# Patient Record
Sex: Female | Born: 1950
Health system: Southern US, Community
[De-identification: ages and names within clinical notes are randomized; demographics above are authoritative.]

## PROBLEM LIST (undated history)

## (undated) HISTORY — PX: FOOT SURGERY: SHX648

---

## 2017-04-20 ENCOUNTER — Encounter: Payer: Self-pay | Admitting: Emergency Medicine

## 2017-04-20 ENCOUNTER — Emergency Department: Payer: Medicare Other

## 2017-04-20 ENCOUNTER — Emergency Department
Admission: EM | Admit: 2017-04-20 | Discharge: 2017-04-21 | Disposition: A | Discharge status: Home / Self Care | Payer: Medicare Other | Attending: Emergency Medicine | Admitting: Emergency Medicine

## 2017-04-20 DIAGNOSIS — M5412 Radiculopathy, cervical region: Secondary | ICD-10-CM | POA: Insufficient documentation

## 2017-04-20 DIAGNOSIS — R918 Other nonspecific abnormal finding of lung field: Secondary | ICD-10-CM | POA: Insufficient documentation

## 2017-04-20 DIAGNOSIS — M79602 Pain in left arm: Secondary | ICD-10-CM | POA: Insufficient documentation

## 2017-04-20 DIAGNOSIS — M542 Cervicalgia: Secondary | ICD-10-CM

## 2017-04-20 LAB — CBC
HCT: 42.7 % (ref 35.0–47.0)
Hemoglobin: 14.8 g/dL (ref 12.0–16.0)
MCH: 32.8 pg (ref 26.0–34.0)
MCHC: 34.7 g/dL (ref 32.0–36.0)
MCV: 94.5 fL (ref 80.0–100.0)
PLATELETS: 303 10*3/uL (ref 150–440)
RBC: 4.52 MIL/uL (ref 3.80–5.20)
RDW: 12.5 % (ref 11.5–14.5)
WBC: 6.6 10*3/uL (ref 3.6–11.0)

## 2017-04-20 LAB — BASIC METABOLIC PANEL
Anion gap: 8 (ref 5–15)
BUN: 16 mg/dL (ref 6–20)
CALCIUM: 9.5 mg/dL (ref 8.9–10.3)
CO2: 23 mmol/L (ref 22–32)
CREATININE: 0.73 mg/dL (ref 0.44–1.00)
Chloride: 106 mmol/L (ref 101–111)
Glucose, Bld: 110 mg/dL — ABNORMAL HIGH (ref 65–99)
Potassium: 4.1 mmol/L (ref 3.5–5.1)
Sodium: 137 mmol/L (ref 135–145)

## 2017-04-20 LAB — TROPONIN I

## 2017-04-20 MED ORDER — LIDOCAINE 5 % EX PTCH
1.0000 | MEDICATED_PATCH | CUTANEOUS | Status: DC
  Administered 2017-04-21: 1 via TRANSDERMAL
  Filled 2017-04-20: qty 1

## 2017-04-20 MED ORDER — LIDOCAINE 5 % EX PTCH: 1.0000 | MEDICATED_PATCH | Freq: Two times a day (BID) | CUTANEOUS | 0 refills | Status: AC

## 2017-04-20 MED ORDER — ETODOLAC 200 MG PO CAPS: 200.0000 mg | ORAL_CAPSULE | Freq: Three times a day (TID) | ORAL | 0 refills | Status: DC

## 2017-04-20 NOTE — Discharge Instructions (Signed)
PLease follow up with orthopedic surgery for further evaluation

## 2017-04-20 NOTE — ED Provider Notes (Signed)
[facility] Emergency Department Provider Note   ____________________________________________   First MD Initiated Contact with Patient 04/20/17 2307     (approximate)  I have reviewed the triage vital signs and the nursing notes.   HISTORY  Chief Complaint Neck Injury and Arm Injury    HPI Joyce Estes is a 66 y.o. female who comes into the hospital today with some neck pain. The patient states the past 3 days she has had pain from her neck down. The patient is dull right now but it was so bad that she could barely move her left arm. The patient states that it doesn't hurt right now. She has had intermittent pain in her neck and arm since November. The patient works at [employer] and a nurse at that facility told her that she should come into the Emergency Department today. The patient has been taking aspirin for the last few days. She took some at 3pm but the pain returned severe at 6pm. The patient is concerned about the possibility of a heart attack. She denies any chest pain. SOB. N/V, light headedness or dizziness. She does not have a primary care physician. The neck pain is worse with movement of her neck.   History reviewed. No pertinent past medical history.  There are no active problems to display for this patient.   Past Surgical History:  Procedure Laterality Date  . FOOT SURGERY      Prior to Admission medications   Medication Sig Start Date End Date Taking? Authorizing Provider  etodolac (LODINE) 200 MG capsule Take 1 capsule (200 mg total) by mouth every 8 (eight) hours. 04/20/17   Rebecka Apley, MD  lidocaine (LIDODERM) 5 % Place 1 patch onto the skin every 12 (twelve) hours. Remove & Discard patch within 12 hours or as directed by MD 04/20/17 04/20/18  Rebecka Apley, MD    Allergies Patient has no known allergies.  No family history on file.  Social History Social History  Substance Use Topics  . Smoking status:  Never Smoker  . Smokeless tobacco: Never Used  . Alcohol use Not on file    Review of Systems  Constitutional: No fever/chills Eyes: No visual changes. ENT: No sore throat. Cardiovascular: Denies chest pain. Respiratory: Denies shortness of breath. Gastrointestinal: No abdominal pain.  No nausea, no vomiting.  No diarrhea.  No constipation. Genitourinary: Negative for dysuria. Musculoskeletal: neck pain with radiation down the left arm.  Skin: Negative for rash. Neurological: Negative for headaches, focal weakness or numbness.   ____________________________________________   PHYSICAL EXAM:  VITAL SIGNS: ED Triage Vitals  Enc Vitals Group     BP 04/20/17 1846 137/88     Pulse Rate 04/20/17 1846 68     Resp 04/20/17 1846 16     Temp 04/20/17 1846 98.3 F (36.8 C)     Temp Source 04/20/17 1846 Oral     SpO2 04/20/17 1846 97 %     Weight 04/20/17 1842 130 lb (59 kg)     Height 04/20/17 1842 5\' 1"  (1.549 m)     Head Circumference --      Peak Flow --      Pain Score 04/20/17 1842 10     Pain Loc --      Pain Edu? --      Excl. in GC? --     Constitutional: Alert and oriented. Well appearing and in mild distress. Eyes: Conjunctivae are normal. PERRL. EOMI. Head:  Atraumatic. Nose: No congestion/rhinnorhea. Mouth/Throat: Mucous membranes are moist.  Oropharynx non-erythematous. Neck: No cervical spine tenderness to palpation. Cardiovascular: Normal rate, regular rhythm. Grossly normal heart sounds.  Good peripheral circulation. Respiratory: Normal respiratory effort.  No retractions. Lungs CTAB. Gastrointestinal: Soft and nontender. No distention. Positive bowel sounds Musculoskeletal: No lower extremity tenderness nor edema.   Neurologic:  Normal speech and language.  Skin:  Skin is warm, dry and intact.  Psychiatric: Mood and affect are normal.   ____________________________________________   LABS (all labs ordered are listed, but only abnormal results are  displayed)  Labs Reviewed  BASIC METABOLIC PANEL - Abnormal; Notable for the following:       Result Value   Glucose, Bld 110 (*)    All other components within normal limits  CBC  TROPONIN I   ____________________________________________  EKG  ED ECG REPORT I, Rebecka Apley, the attending physician, personally viewed and interpreted this ECG.   Date: 04/20/2017  EKG Time: 1841  Rate: 78  Rhythm: normal sinus rhythm  Axis: normal  Intervals:none  ST&T Change: none  ____________________________________________  RADIOLOGY  Dg Chest 2 View  Result Date: 04/20/2017 CLINICAL DATA:  Left upper chest pain radiates into left arm. EXAM: CHEST  2 VIEW COMPARISON:  None. FINDINGS: Lungs are hyperexpanded. Interstitial markings are diffusely coarsened with chronic features. Ill-defined density identified in the left lung apex. The otherwise lungs are clear without focal pneumonia, edema, pneumothorax or pleural effusion. The cardiopericardial silhouette is within normal limits for size. The visualized bony structures of the thorax are intact. Thoracolumbar scoliosis evident. IMPRESSION: 1. Ill-defined opacity in the left lung apex, indeterminate. CT chest without contrast recommended to further evaluate. 2.  Hyperexpansion is consistent with emphysema. Electronically Signed   By: Kennith Center M.D.   On: 04/20/2017 20:31   Dg Cervical Spine 2-3 Views  Result Date: 04/20/2017 CLINICAL DATA:  66 year old female with left cervical neck pain radiating down the left arm. EXAM: CERVICAL SPINE - 2-3 VIEW COMPARISON:  Chest CT 2240 hours today. FINDINGS: Normal prevertebral soft tissue contour. Cervicothoracic junction alignment is within normal limits. Dextroconvex cervical and levoconvex upper thoracic scoliosis. Cervical disc space loss and endplate spurring from the C3 to the C6 level. C1-C2 alignment appears preserved although there is suggestion of right side C1 C2 joint space loss.  Multilevel cervical facet hypertrophy. No acute osseous abnormality identified. Left cervical carotid artery calcified atherosclerosis. IMPRESSION: 1. Cervicothoracic scoliosis with widespread spinal degenerative changes. 2.  No acute osseous abnormality identified. Electronically Signed   By: Odessa Fleming M.D.   On: 04/20/2017 23:43   Ct Chest Wo Contrast  Result Date: 04/20/2017 CLINICAL DATA:  Abnormal chest radiograph EXAM: CT CHEST WITHOUT CONTRAST TECHNIQUE: Multidetector CT imaging of the chest was performed following the standard protocol without IV contrast. COMPARISON:  Chest radiographs dated 04/20/2017 FINDINGS: Cardiovascular: The heart is normal in size. No pericardial effusion. No evidence of thoracic aortic aneurysm. Very mild atherosclerotic calcifications of the aortic arch. Mediastinum/Nodes: No suspicious mediastinal lymphadenopathy. Visualized thyroid is notable for a 10 mm left thyroid nodule (series 2/ image 19). Lungs/Pleura: Lungs are essentially clear. Specifically, no CT abnormality in the left lung apex is present. Evaluation of the lung parenchyma is constrained by respiratory motion. Within that constraint, there are no suspicious pulmonary nodules. Mild lingular scarring/ atelectasis. Right posterior Bochdalek's hernia. Left posterior Bochdalek's hernia. No pleural effusion or pneumothorax. Upper Abdomen: Visualized upper abdomen is unremarkable. Musculoskeletal: Visualized osseous structures are  within normal limits. IMPRESSION: No evidence of acute cardiopulmonary disease. Specifically, the left lung apex is clear. Aortic Atherosclerosis (ICD10-I70.0). Electronically Signed   By: Charline Bills M.D.   On: 04/20/2017 23:13    ____________________________________________   PROCEDURES  Procedure(s) performed: None  Procedures  Critical Care performed: No  ____________________________________________   INITIAL IMPRESSION / ASSESSMENT AND PLAN / ED COURSE  Pertinent  labs & imaging results that were available during my care of the patient were reviewed by me and considered in my medical decision making (see chart for details).  This is a 66 year old female who comes into the ho with some neck pain radiating down her left arm. The patient denies pain at this time although she states that she still feels something dull. The patient had 2 sets of enzymes drawn that were negative and she had imaging done which showed some cervical scoliosis and degenerative disease. The patient will be discharged to home and instructed to follow up with orthopedic surgery. The patient did receive a lidoderm patch to her neck.        ____________________________________________   FINAL CLINICAL IMPRESSION(S) / ED DIAGNOSES  Final diagnoses:  Cervical radiculopathy  Neck pain      NEW MEDICATIONS STARTED DURING THIS VISIT:  Discharge Medication List as of 04/20/2017 11:52 PM    START taking these medications   Details  etodolac (LODINE) 200 MG capsule Take 1 capsule (200 mg total) by mouth every 8 (eight) hours., Starting Fri 04/20/2017, Print    lidocaine (LIDODERM) 5 % Place 1 patch onto the skin every 12 (twelve) hours. Remove & Discard patch within 12 hours or as directed by MD, Starting Fri 04/20/2017, Until Sat 04/20/2018, Print         Note:  This document was prepared using Dragon voice recognition software and may include unintentional dictation errors.    Rebecka Apley, MD 04/21/17 [id number]

## 2017-04-20 NOTE — ED Triage Notes (Signed)
C/O left neck pain radiating down left arm.  Denies chest pain.

## 2017-04-20 NOTE — ED Notes (Signed)
Pt. States pain to lt. Side of face that radiates down lt. Arm.  Pt. States this pain has been intermittent for the past couple months, but more so in the last two days.  Pt. Denies pain at this time.  Pt. States increase pain when moving lt. Arm.

## 2017-04-20 NOTE — ED Notes (Signed)
Pt. States she had some teeth work done late last year and states the problem started around same time of lt. Facial pain.

## 2017-04-21 ENCOUNTER — Encounter: Payer: Self-pay | Admitting: Emergency Medicine

## 2017-04-21 NOTE — ED Notes (Signed)
Pt. Going home with family. 

## 2018-06-17 ENCOUNTER — Encounter: Payer: Self-pay | Admitting: Nurse Practitioner

## 2018-06-17 ENCOUNTER — Ambulatory Visit: Payer: Medicare Other | Admitting: Nurse Practitioner

## 2018-06-17 ENCOUNTER — Other Ambulatory Visit: Payer: Self-pay

## 2018-06-17 VITALS — BP 101/69 | HR 90 | Temp 97.7°F | Ht 59.2 in | Wt 126.5 lb

## 2018-06-17 DIAGNOSIS — M25561 Pain in right knee: Secondary | ICD-10-CM | POA: Diagnosis not present

## 2018-06-17 DIAGNOSIS — Z23 Encounter for immunization: Secondary | ICD-10-CM | POA: Diagnosis not present

## 2018-06-17 DIAGNOSIS — M170 Bilateral primary osteoarthritis of knee: Secondary | ICD-10-CM | POA: Insufficient documentation

## 2018-06-17 DIAGNOSIS — M25562 Pain in left knee: Secondary | ICD-10-CM

## 2018-06-17 DIAGNOSIS — G8929 Other chronic pain: Secondary | ICD-10-CM

## 2018-06-17 NOTE — Progress Notes (Signed)
BP 101/69   Pulse 90   Temp 97.7 F (36.5 C) (Oral)   Ht 4' 11.2" (1.504 m)   Wt 126 lb 8 oz (57.4 kg)   SpO2 93%   BMI 25.38 kg/m    Subjective:    Patient ID: Joyce Estes, female    DOB: [birth year], 67 y.o.   MRN: [id number]  HPI: Joyce Estes is a 67 y.o. female presents for initial new patient visit.  Introduced to Publishing rights manager role and all questions answered.    Chief Complaint  Patient presents with  . New Patient (Initial Visit)    pt states that she would like to discuss about her knees and legs pain that its been going on since Sep 2nd/ States been taking aleve   Pt has not seen primary care provider in a long while due to having no healthcare insurance.  She denies any chronic health issues.    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  On feet for four hours, her total shift, works 4 four hour shifts a week.  She does note more stiffness in knees when getting up from sitting for prolonged period and when on feet for long period of time.  She does not currently have a vehicle to drive and often runs errands and attends appointments by walking, walked to provider appointment today.  She lives with her brother.  Does report this is mildly affecting her ability to perform ADL's and work.  Denies recent falls or trauma to knees.   Duration: months, September 2019 (beginning of month) Involved knee: bilateral, with right knee having worse pain than left Mechanism of injury: unknown Location:anterior and posterior knee pain Onset: gradual. The more she stands on it the worse it becomes. Severity: 10/10 when up walking and 4/10 when sitting Quality:  sharp and throbbing, like "someone is twisting it" Frequency: constant, Aleeve has helped "take edge off" but pain is constant Radiation: yes , she feels pain radiates down her right leg into feet Aggravating factors: walking, bending and movement  Alleviating  factors: Aleeve, Icy/Hot lotion  Status: reports it has not gotten better or worse Treatments attempted: aleve and Icy/Hot lotion Relief with NSAIDs?:  mild Weakness with weight bearing or walking: yes Sensation of giving way: no Locking: yes, sometimes right knee locks on patient, but she has not lost balance or had falls Popping: no Bruising: no Swelling: yes, notices swelling in knee more after she has been on feet for prolonged period  Redness: no Paresthesias/decreased sensation: no Fevers: no   Relevant past medical, surgical, family and social history reviewed and updated as indicated. Interim medical history since our last visit reviewed. Allergies and medications reviewed and updated.  Review of Systems  Constitutional: Negative for activity change, appetite change, diaphoresis and fatigue.  Eyes: Negative for discharge, redness and itching.  Respiratory: Negative for cough, chest tightness and shortness of breath.   Cardiovascular: Negative for chest pain, palpitations and leg swelling.  Gastrointestinal: Negative for abdominal distention, abdominal pain, constipation, diarrhea, nausea and vomiting.  Endocrine: Negative.   Genitourinary: Negative.   Musculoskeletal: Positive for arthralgias and joint swelling. Negative for back pain, myalgias, neck pain and neck stiffness.       Bilateral knee pain R>L  Skin: Negative for pallor, rash and wound.  Allergic/Immunologic: Negative for environmental allergies.  Neurological: Negative for dizziness, numbness and headaches.  Hematological: Does not bruise/bleed easily.  Psychiatric/Behavioral: Negative for behavioral problems. The patient is not nervous/anxious.     Per HPI unless specifically indicated above     Objective:    BP 101/69   Pulse 90   Temp 97.7 F (36.5 C) (Oral)   Ht 4' 11.2" (1.504 m)   Wt 126 lb 8 oz (57.4 kg)   SpO2 93%   BMI 25.38 kg/m   Wt Readings from Last 3 Encounters:  06/17/18 126 lb 8 oz  (57.4 kg)  04/20/17 130 lb (59 kg)    Physical Exam  Constitutional: She is oriented to person, place, and time. She appears well-developed and well-nourished.  HENT:  Head: Normocephalic and atraumatic.  Eyes: Pupils are equal, round, and reactive to light.  Neck: Normal range of motion. Neck supple.  Cardiovascular: Normal rate, regular rhythm and normal heart sounds.  Pulmonary/Chest: Effort normal and breath sounds normal.  Abdominal: Soft. Bowel sounds are normal.  Musculoskeletal:       Right knee: She exhibits no swelling, no ecchymosis and no erythema. No tenderness found.       Left knee: She exhibits normal range of motion, no swelling, no ecchymosis and no erythema. No tenderness found.  Moderate crepitus felt to right knee with mobility.  Mild tenderness on rotation inward, none outward.  Left knee with mild crepitus and no tenderness.  Full ROM bilateral lower extremity without grimacing or report pain.  Bilateral upper extremities with full ROM.  Neurological: She is alert and oriented to person, place, and time.  Skin: Skin is warm and dry.  Psychiatric: She has a normal mood and affect. Her behavior is normal. Judgment and thought content normal.  Nursing note and vitals reviewed.    Results for orders placed or performed during the hospital encounter of 04/20/17  Basic metabolic panel  Result Value Ref Range   Sodium 137 135 - 145 mmol/L   Potassium 4.1 3.5 - 5.1 mmol/L   Chloride 106 101 - 111 mmol/L   CO2 23 22 - 32 mmol/L   Glucose, Bld 110 (H) 65 - 99 mg/dL   BUN 16 6 - 20 mg/dL   Creatinine, Ser 1.61 0.44 - 1.00 mg/dL   Calcium 9.5 8.9 - 09.6 mg/dL   GFR calc non Af Amer >60 >60 mL/min   GFR calc Af Amer >60 >60 mL/min   Anion gap 8 5 - 15  CBC  Result Value Ref Range   WBC 6.6 3.6 - 11.0 K/uL   RBC 4.52 3.80 - 5.20 MIL/uL   Hemoglobin 14.8 12.0 - 16.0 g/dL   HCT 04.5 40.9 - 81.1 %   MCV 94.5 80.0 - 100.0 fL   MCH 32.8 26.0 - 34.0 pg   MCHC 34.7 32.0  - 36.0 g/dL   RDW 91.4 78.2 - 95.6 %   Platelets 303 150 - 440 K/uL  Troponin I  Result Value Ref Range   Troponin I <0.03 <0.03 ng/mL      Assessment & Plan:   Problem List Items Addressed This Visit      Other   Knee pain, bilateral    Chronic, ongoing over one month with R>L.  Appears OA in nature.  Imaging bilateral knees ordered and patient given directions to location for imaging. Encouraged patient to use ice, Icy/Hot patches, Tylenol (max dose 3000MG  total a day), and obtain knee support for when up working on feet.       Other Visit Diagnoses    Flu vaccine need    -  Primary   Relevant Orders   Flu vaccine HIGH DOSE PF (Completed)   Need for pneumococcal vaccination       Relevant Orders   Pneumococcal conjugate vaccine 13-valent IM (Completed)       Follow up plan: Return in about 2 weeks (around 07/01/2018) for physical exam (initial).

## 2018-06-17 NOTE — Patient Instructions (Addendum)
For knee pain may use Icy/Hot patches, which you can obtain OTC at drugstore or [facility].  Ice packs when you have been on feet a lot and for swelling.  May use Tylenol for pain (max dose 3000MG  a day total).  Knee support sleeve for pain, can obtain at drugstore or [facility].  Pneumococcal Conjugate Vaccine suspension for injection What is this medicine? PNEUMOCOCCAL VACCINE (NEU mo KOK al vak SEEN) is a vaccine used to prevent pneumococcus bacterial infections. These bacteria can cause serious infections like pneumonia, meningitis, and blood infections. This vaccine will lower your chance of getting pneumonia. If you do get pneumonia, it can make your symptoms milder and your illness shorter. This vaccine will not treat an infection and will not cause infection. This vaccine is recommended for infants and young children, adults with certain medical conditions, and adults 65 years or older. This medicine may be used for other purposes; ask your health care provider or pharmacist if you have questions. COMMON BRAND NAME(S): Prevnar, Prevnar 13 What should I tell my health care provider before I take this medicine? They need to know if you have any of these conditions: -bleeding problems -fever -immune system problems -an unusual or allergic reaction to pneumococcal vaccine, diphtheria toxoid, other vaccines, latex, other medicines, foods, dyes, or preservatives -pregnant or trying to get pregnant -breast-feeding How should I use this medicine? This vaccine is for injection into a muscle. It is given by a health care professional. A copy of Vaccine Information Statements will be given before each vaccination. Read this sheet carefully each time. The sheet may change frequently. Talk to your pediatrician regarding the use of this medicine in children. While this drug may be prescribed for children as young as 84 weeks old for selected conditions, precautions do apply. Overdosage: If you think you have  taken too much of this medicine contact a poison control center or emergency room at once. NOTE: This medicine is only for you. Do not share this medicine with others. What if I miss a dose? It is important not to miss your dose. Call your doctor or health care professional if you are unable to keep an appointment. What may interact with this medicine? -medicines for cancer chemotherapy -medicines that suppress your immune function -steroid medicines like prednisone or cortisone This list may not describe all possible interactions. Give your health care provider a list of all the medicines, herbs, non-prescription drugs, or dietary supplements you use. Also tell them if you smoke, drink alcohol, or use illegal drugs. Some items may interact with your medicine. What should I watch for while using this medicine? Mild fever and pain should go away in 3 days or less. Report any unusual symptoms to your doctor or health care professional. What side effects may I notice from receiving this medicine? Side effects that you should report to your doctor or health care professional as soon as possible: -allergic reactions like skin rash, itching or hives, swelling of the face, lips, or tongue -breathing problems -confused -fast or irregular heartbeat -fever over 102 degrees F -seizures -unusual bleeding or bruising -unusual muscle weakness Side effects that usually do not require medical attention (report to your doctor or health care professional if they continue or are bothersome): -aches and pains -diarrhea -fever of 102 degrees F or less -headache -irritable -loss of appetite -pain, tender at site where injected -trouble sleeping This list may not describe all possible side effects. Call your doctor for medical advice about side  effects. You may report side effects to FDA at 1-800-FDA-1088. Where should I keep my medicine? This does not apply. This vaccine is given in a clinic, pharmacy,  doctor's office, or other health care setting and will not be stored at home. NOTE: This sheet is a summary. It may not cover all possible information. If you have questions about this medicine, talk to your doctor, pharmacist, or health care provider.  2018 Elsevier/Gold Standard (2014-05-21 10:27:27)  Knee Pain, Adult Many things can cause knee pain. The pain often goes away on its own with time and rest. If the pain does not go away, tests may be done to find out what is causing the pain. Follow these instructions at home: Activity  Rest your knee.  Do not do things that cause pain.  Avoid activities where both feet leave the ground at the same time (high-impact activities). Examples are running, jumping rope, and doing jumping jacks. General instructions  Take medicines only as told by your doctor.  Raise (elevate) your knee when you are resting. Make sure your knee is higher than your heart.  Sleep with a pillow under your knee.  If told, put ice on the knee: ? Put ice in a plastic bag. ? Place a towel between your skin and the bag. ? Leave the ice on for 20 minutes, 2-3 times a day.  Ask your doctor if you should wear an elastic knee support.  Lose weight if you are overweight. Being overweight can make your knee hurt more.  Do not use any tobacco products. These include cigarettes, chewing tobacco, or electronic cigarettes. If you need help quitting, ask your doctor. Smoking may slow down healing. Contact a doctor if:  The pain does not stop.  The pain changes or gets worse.  You have a fever along with knee pain.  Your knee gives out or locks up.  Your knee swells, and becomes worse. Get help right away if:  Your knee feels warm.  You cannot move your knee.  You have very bad knee pain.  You have chest pain.  You have trouble breathing. Summary  Many things can cause knee pain. The pain often goes away on its own with time and rest.  Avoid activities  that put stress on your knee. These include running and jumping rope.  Get help right away if you cannot move your knee, or if your knee feels warm, or if you have trouble breathing. This information is not intended to replace advice given to you by your health care provider. Make sure you discuss any questions you have with your health care provider. Document Released: 11/10/2008 Document Revised: 08/08/2016 Document Reviewed: 08/08/2016 Elsevier Interactive Patient Education  2017 [employer].

## 2018-06-17 NOTE — Assessment & Plan Note (Signed)
Chronic, ongoing over one month with R>L.  Appears OA in nature.  Imaging bilateral knees ordered and patient given directions to location for imaging. Encouraged patient to use ice, Icy/Hot patches, Tylenol (max dose 3000MG  total a day), and obtain knee support for when up working on feet.

## 2018-07-02 ENCOUNTER — Ambulatory Visit
Admission: RE | Admit: 2018-07-02 | Discharge: 2018-07-02 | Disposition: A | Discharge status: Home / Self Care | Payer: Medicare Other | Source: Ambulatory Visit | Attending: Nurse Practitioner | Admitting: Nurse Practitioner

## 2018-07-02 DIAGNOSIS — M1711 Unilateral primary osteoarthritis, right knee: Secondary | ICD-10-CM | POA: Diagnosis not present

## 2018-07-02 DIAGNOSIS — G8929 Other chronic pain: Secondary | ICD-10-CM

## 2018-07-02 DIAGNOSIS — M25562 Pain in left knee: Principal | ICD-10-CM

## 2018-07-02 DIAGNOSIS — M170 Bilateral primary osteoarthritis of knee: Secondary | ICD-10-CM | POA: Insufficient documentation

## 2018-07-02 DIAGNOSIS — M25561 Pain in right knee: Secondary | ICD-10-CM | POA: Diagnosis present

## 2018-07-02 DIAGNOSIS — M1712 Unilateral primary osteoarthritis, left knee: Secondary | ICD-10-CM | POA: Diagnosis not present

## 2018-07-03 ENCOUNTER — Encounter: Payer: Self-pay | Admitting: Nurse Practitioner

## 2018-07-03 ENCOUNTER — Ambulatory Visit (INDEPENDENT_AMBULATORY_CARE_PROVIDER_SITE_OTHER): Payer: Medicare Other | Admitting: Nurse Practitioner

## 2018-07-03 VITALS — BP 126/71 | HR 92 | Temp 98.4°F | Ht 59.0 in | Wt 127.8 lb

## 2018-07-03 DIAGNOSIS — M25561 Pain in right knee: Secondary | ICD-10-CM

## 2018-07-03 DIAGNOSIS — Z0000 Encounter for general adult medical examination without abnormal findings: Secondary | ICD-10-CM

## 2018-07-03 DIAGNOSIS — M25562 Pain in left knee: Secondary | ICD-10-CM

## 2018-07-03 DIAGNOSIS — M170 Bilateral primary osteoarthritis of knee: Secondary | ICD-10-CM

## 2018-07-03 DIAGNOSIS — G8929 Other chronic pain: Secondary | ICD-10-CM | POA: Diagnosis not present

## 2018-07-03 NOTE — Progress Notes (Signed)
BP 126/71   Pulse 92   Temp 98.4 F (36.9 C) (Oral)   Ht 4' 11"  (1.499 m)   Wt 127 lb 12.8 oz (58 kg)   SpO2 91%   BMI 25.81 kg/m    Subjective:    Patient ID: Joyce Estes, female    DOB: 15-Feb-1951, 67 y.o.   MRN: [id number]  HPI: Joyce Estes is a 67 y.o. female  Chief Complaint  Patient presents with  . Annual Exam    pt states she is still having ongoing pain in her knee   KNEE PAIN (OA bilateral knees) Recent imaging to bilateral knees performed on 07/02/18 noting "moderate patellofemoral joint degenerative changes, mild medial & lateral tibiofemoral joint space narrowing, and bones may be osteopenic".  Discussed these findings at length with patient.  She works four days a week in a strenuous job in [place] and is often on feet, bending and lifting.  Has been trying Tylenol, ice, and creams at home with minimal benefit. Duration: months Involved knee: bilateral Mechanism of injury: bending and lifting at workplace [place] and posterior Onset: gradual Severity: 7/10 at worst and 2/10 at best Quality:  dull and aching Frequency: intermittent Radiation: no Aggravating factors: weight bearing, walking and movement  Alleviating factors: ice, APAP and rest , creams Status: remains same, no worse or better Treatments attempted: ice and APAP  Relief with NSAIDs?:  No Weakness with weight bearing or walking: no Sensation of giving way: no Locking: no Popping: yes Bruising: no Swelling: yes, after prolonged work day Redness: no Paresthesias/decreased sensation: no Fevers: no  Relevant past medical, surgical, family and social history reviewed and updated as indicated. Interim medical history since our last visit reviewed. Allergies and medications reviewed and updated.  Review of Systems  Constitutional: Negative for activity change, appetite change, fatigue, fever and unexpected weight change.  HENT: Negative for congestion, hearing loss, rhinorrhea,  sinus pressure, sinus pain, trouble swallowing and voice change.   Eyes: Negative for pain, redness, itching and visual disturbance.  Respiratory: Negative for cough, chest tightness, shortness of breath and wheezing.   Cardiovascular: Negative for chest pain, palpitations and leg swelling.  Gastrointestinal: Negative for abdominal distention, abdominal pain, constipation, diarrhea, nausea and vomiting.  Endocrine: Negative for cold intolerance, heat intolerance, polydipsia, polyphagia and polyuria.  Genitourinary: Negative.   Musculoskeletal: Negative for arthralgias, joint swelling, myalgias and neck stiffness.  Skin: Negative for color change, rash and wound.  Allergic/Immunologic: Negative.   Neurological: Negative for dizziness, numbness and headaches.  Hematological: Negative.   Psychiatric/Behavioral: Negative for behavioral problems, confusion, decreased concentration, sleep disturbance and suicidal ideas. The patient is not nervous/anxious.     Per HPI unless specifically indicated above     Objective:    BP 126/71   Pulse 92   Temp 98.4 F (36.9 C) (Oral)   Ht 4' 11"  (1.499 m)   Wt 127 lb 12.8 oz (58 kg)   SpO2 91%   BMI 25.81 kg/m   Wt Readings from Last 3 Encounters:  07/03/18 127 lb 12.8 oz (58 kg)  06/17/18 126 lb 8 oz (57.4 kg)  04/20/17 130 lb (59 kg)    Physical Exam  Constitutional: She is oriented to person, place, and time. She appears well-developed and well-nourished.  HENT:  Head: Normocephalic and atraumatic.  Right Ear: Hearing, tympanic membrane, external ear and ear canal normal.  Left Ear: Hearing, tympanic membrane, external ear and ear canal normal.  Nose: Nose normal. Right  sinus exhibits no maxillary sinus tenderness and no frontal sinus tenderness. Left sinus exhibits no maxillary sinus tenderness and no frontal sinus tenderness.  Mouth/Throat: Oropharynx is clear and moist.  Eyes: Pupils are equal, round, and reactive to light. Conjunctivae  and EOM are normal. Right eye exhibits no discharge. Left eye exhibits no discharge.  Neck: Normal range of motion. Neck supple. No JVD present. Carotid bruit is not present. No thyromegaly present.  Cardiovascular: Normal rate, regular rhythm, normal heart sounds and intact distal pulses.  Pulmonary/Chest: Effort normal and breath sounds normal.  Abdominal: Soft. Bowel sounds are normal. There is no splenomegaly or hepatomegaly.  Genitourinary:  Genitourinary Comments: Deferred, patient declined  Musculoskeletal: Normal range of motion.       Right knee: She exhibits normal range of motion, no swelling and no erythema. No tenderness found.       Left knee: She exhibits normal range of motion, no swelling, no ecchymosis and no erythema. No tenderness found.  Crepitus felt to bilateral knees with ROM.  Lymphadenopathy:    She has no cervical adenopathy.  Neurological: She is alert and oriented to person, place, and time. She has normal strength and normal reflexes. No cranial nerve deficit. She displays a negative Romberg sign.  Reflex Scores:      Brachioradialis reflexes are 2+ on the right side and 2+ on the left side.      Patellar reflexes are 2+ on the right side and 2+ on the left side. Skin: Skin is warm, dry and intact.  Psychiatric: She has a normal mood and affect. Her behavior is normal.    Results for orders placed or performed during the hospital encounter of 21/30/86  Basic metabolic panel  Result Value Ref Range   Sodium 137 135 - 145 mmol/L   Potassium 4.1 3.5 - 5.1 mmol/L   Chloride 106 101 - 111 mmol/L   CO2 23 22 - 32 mmol/L   Glucose, Bld 110 (H) 65 - 99 mg/dL   BUN 16 6 - 20 mg/dL   Creatinine, Ser 0.73 0.44 - 1.00 mg/dL   Calcium 9.5 8.9 - 10.3 mg/dL   GFR calc non Af Amer >60 >60 mL/min   GFR calc Af Amer >60 >60 mL/min   Anion gap 8 5 - 15  CBC  Result Value Ref Range   WBC 6.6 3.6 - 11.0 K/uL   RBC 4.52 3.80 - 5.20 MIL/uL   Hemoglobin 14.8 12.0 - 16.0  g/dL   HCT 42.7 35.0 - 47.0 %   MCV 94.5 80.0 - 100.0 fL   MCH 32.8 26.0 - 34.0 pg   MCHC 34.7 32.0 - 36.0 g/dL   RDW 12.5 11.5 - 14.5 %   Platelets 303 150 - 440 K/uL  Troponin I  Result Value Ref Range   Troponin I <0.03 <0.03 ng/mL      Assessment & Plan:   Problem List Items Addressed This Visit      Musculoskeletal and Integument   Osteoarthritis of both knees    Imaging noted 07/02/18 OA.  Referral to orthopedics and physical therapy.  Continue to use Tylenol, ice, and creams.  Obtain knee supports to wear while at work.       Other Visit Diagnoses    Annual physical exam    -  Primary   Relevant Orders   Ambulatory referral to Ophthalmology   MM 3D SCREEN BREAST BILATERAL   DG Bone Density   CBC w/Diff  TSH   Comp Met (CMET)   HgB A1c      Time: 25 minutes, >50% spent counseling/or care coordination for osteoarthritis care and annual screening set-up  Follow up plan: Return in about 6 months (around 01/01/2019) for Osteoarthritis.

## 2018-07-03 NOTE — Patient Instructions (Signed)

## 2018-07-03 NOTE — Assessment & Plan Note (Addendum)
Imaging noted 07/02/18 OA.  Referral to orthopedics and physical therapy.  Continue to use Tylenol, ice, and creams.  Obtain knee supports to wear while at work.

## 2018-07-04 LAB — COMPREHENSIVE METABOLIC PANEL
A/G RATIO: 1.4 (ref 1.2–2.2)
ALBUMIN: 3.9 g/dL (ref 3.6–4.8)
ALK PHOS: 103 IU/L (ref 39–117)
ALT: 12 IU/L (ref 0–32)
AST: 16 IU/L (ref 0–40)
BILIRUBIN TOTAL: 0.7 mg/dL (ref 0.0–1.2)
BUN / CREAT RATIO: 19 (ref 12–28)
BUN: 13 mg/dL (ref 8–27)
CHLORIDE: 105 mmol/L (ref 96–106)
CO2: 22 mmol/L (ref 20–29)
Calcium: 9.5 mg/dL (ref 8.7–10.3)
Creatinine, Ser: 0.68 mg/dL (ref 0.57–1.00)
GFR calc non Af Amer: 90 mL/min/{1.73_m2} (ref >59)
GFR, EST AFRICAN AMERICAN: 103 mL/min/{1.73_m2} (ref >59)
Globulin, Total: 2.8 g/dL (ref 1.5–4.5)
Glucose: 86 mg/dL (ref 65–99)
POTASSIUM: 4.2 mmol/L (ref 3.5–5.2)
Sodium: 141 mmol/L (ref 134–144)
TOTAL PROTEIN: 6.7 g/dL (ref 6.0–8.5)

## 2018-07-04 LAB — HEMOGLOBIN A1C
Est. average glucose Bld gHb Est-mCnc: 108 mg/dL
HEMOGLOBIN A1C: 5.4 % (ref 4.8–5.6)

## 2018-07-04 LAB — CBC WITH DIFFERENTIAL/PLATELET
BASOS ABS: 0 10*3/uL (ref 0.0–0.2)
Basos: 1 %
EOS (ABSOLUTE): 0.1 10*3/uL (ref 0.0–0.4)
EOS: 2 %
Hematocrit: 40.2 % (ref 34.0–46.6)
Hemoglobin: 13.5 g/dL (ref 11.1–15.9)
IMMATURE GRANULOCYTES: 0 %
Immature Grans (Abs): 0 10*3/uL (ref 0.0–0.1)
LYMPHS: 34 %
Lymphocytes Absolute: 1.9 10*3/uL (ref 0.7–3.1)
MCH: 31.1 pg (ref 26.6–33.0)
MCHC: 33.6 g/dL (ref 31.5–35.7)
MCV: 93 fL (ref 79–97)
MONOCYTES: 8 %
Monocytes Absolute: 0.4 10*3/uL (ref 0.1–0.9)
NEUTROS PCT: 55 %
Neutrophils Absolute: 3 10*3/uL (ref 1.4–7.0)
Platelets: 318 10*3/uL (ref 150–450)
RBC: 4.34 x10E6/uL (ref 3.77–5.28)
RDW: 11.9 % — ABNORMAL LOW (ref 12.3–15.4)
WBC: 5.4 10*3/uL (ref 3.4–10.8)

## 2018-07-04 LAB — TSH: TSH: 0.899 u[IU]/mL (ref 0.450–4.500)

## 2018-07-17 ENCOUNTER — Encounter: Payer: Self-pay | Admitting: Physical Therapy

## 2018-07-17 ENCOUNTER — Ambulatory Visit: Payer: Medicare Other | Attending: Nurse Practitioner | Admitting: Physical Therapy

## 2018-07-17 ENCOUNTER — Other Ambulatory Visit: Payer: Self-pay

## 2018-07-17 DIAGNOSIS — R262 Difficulty in walking, not elsewhere classified: Secondary | ICD-10-CM | POA: Diagnosis not present

## 2018-07-17 DIAGNOSIS — M25562 Pain in left knee: Secondary | ICD-10-CM | POA: Insufficient documentation

## 2018-07-17 DIAGNOSIS — M25561 Pain in right knee: Secondary | ICD-10-CM | POA: Diagnosis not present

## 2018-07-17 NOTE — Therapy (Signed)
[facility] [address], [place], [address] Phone: [phone]   Fax:  [phone]  Physical Therapy Evaluation  Patient Details  Name: Joyce Estes MRN: [id number] Date of Birth: 11-13-1950 Referring Provider (PT): Marjie Skiff, NP   Encounter Date: 07/17/2018  PT End of Session - 07/18/18 0741    Visit Number  1    Number of Visits  9    Date for PT Re-Evaluation  09/11/18    Authorization Type  [employer] Medicare    Authorization Time Period  Current cert period: 07/17/2018 - 09/11/2018 (last PN: [facility] 07/17/2018)    Authorization - Visit Number  1    Authorization - Number of Visits  9    PT Start Time  1515    PT Stop Time  1615    PT Time Calculation (min)  60 min    Activity Tolerance  Patient tolerated treatment well;Treatment limited secondary to medical complications (Comment)    Behavior During Therapy  [facility] for tasks assessed/performed       History reviewed. No pertinent past medical history.  Past Surgical History:  Procedure Laterality Date  . FOOT SURGERY     SUBJECTIVE: HISTORY:  Patient is a 67 y.o. female who presents to outpatient physical therapy with a referral for chronic pain of both knees; osteoarthritis to both knees with chronic pain, ongoing. This patient's chief complaints consist of bilateral knee pain R > L that started about 04/28/2018, stiffness, and reduced activity tolerance, leading to the following functional deficits: difficulty with work activities including walking, standing; discontinued exercise routine; difficulty with weight bearing activities. Relevant past medical history and comorbidities include left foot surgery in 1984 (has remaninig stiffness in left ankle that has changed her gait pattern since then).  Imaging: Radiograph reports dated 07/02/2018: R knee = "IMPRESSION: 1. Moderate patellofemoral joint degenerative changes. 2. Mild medial and lateral  tibiofemoral joint space narrowing. 3. Bones may be osteopenic." L knee = "IMPRESSION: 1. Moderate patellofemoral joint degenerative changes. 2. Mild medial tibiofemoral joint space narrowing. 3. Minimal lateral tibiofemoral joint degenerative changes. 4. Bones may be osteopenic." Response to previously administered skilled services: none.  Patient states condition started around April 28, 2018. It started in the right knee. Many years ago she had a severe fracture in the left foot and this pain reminder her of it. It is currently mostly on the right but she has pain in both sides. Swelled up in the beginning.  Identifies no injuries or significant life changes at the time. It has been gradually getting better.  SPINAL HISTORY:  Denies back pain. Reports history of neck pain (resolved by mypillow).   FUNCTION Patient-reported Outcome Measure: FOTO = 46 Work: works part time at long term care facility (standing and walking, stands doing dishes for 1 hour after walking back and forth 2 hours in the wings at the long term care facility. Her work shifts ar 4 hours). Hobbies:  Reading, yoga-like exercises intermittently.  PLOF: was able to do yoga-like exercises, work without pain intermittently.Current Level of Function: difficulty working, with prolonged standing, walking due to pain. Unable to do yoga exercises.   PAIN Location: initially right lower leg up thigh to upper third lateral side on the right, only the knee hurt on the left (some left heel stiffness). Now she feels the pain mostly in the knees.  Nature: at first felt like "someone was twisting her leg and breaking it  apart" now if feels more sometimes dull, sometimes sharp, more localized to the knee and sometimes goes up to the thigh.  Pain Scale:  Patient reports current pain as 3/10, at best 3/10, at worst 12/10, and during functional activity 12/10. Paresthesia: sometimes in the mornings in her hands Aggravating factors: standing  up, walking, getting in and out of a car.  Easing factors: medications, arthritis cream, leg sleeve, ice.  24-hour pattern: worse when finishes shift (but better after sitting 5-8 min).  .  There were no vitals filed for this visit.   Subjective Assessment - 07/17/18 1531    Subjective  Patient states condition started around April 28, 2018. It started in the right knee. Many years ago she had a severe fracture in the left foot and this pain reminder her of it. It is currently mostly on the right but she has pain in both sides. Swelled up in the beginning.  Identifies no injuries or significant life changes at the time. It has been gradually getting better    Pertinent History  Patient is a 67 y.o. female who presents to outpatient physical therapy with a referral for chronic pain of both knees; osteoarthritis to both knees with chronic pain, ongoing. This patient's chief complaints consist of bilateral knee pain R > L that started about 04/28/2018, stiffness, and reduced activity tolerance, leading to the following functional deficits: difficulty with work activities including walking, standing; discontinued exercise routine; difficulty with weight bearing activities. Relevant past medical history and comorbidities include left foot surgery in 1984 (has remaninig stiffness in left ankle that has changed her gait pattern since then).  Imaging: Radiograph reports dated 07/02/2018: R knee = "IMPRESSION: 1. Moderate patellofemoral joint degenerative changes. 2. Mild medial and lateral tibiofemoral joint space narrowing. 3. Bones may be osteopenic." L knee = "IMPRESSION: 1. Moderate patellofemoral joint degenerative changes. 2. Mild medial tibiofemoral joint space narrowing. 3. Minimal lateral tibiofemoral joint degenerative changes. 4. Bones may be osteopenic."   work, fitness routine   Limitations  Standing;Walking;House hold activities;Other (comment)    How long can you sit comfortably?  unlimited     How long can you stand comfortably?  15 min    How long can you walk comfortably?  30 min    Diagnostic tests  Imaging: Radiograph reports dated 07/02/2018: R knee = "IMPRESSION: 1. Moderate patellofemoral joint degenerative changes. 2. Mild medial and lateral tibiofemoral joint space narrowing. 3. Bones may be osteopenic." L knee = "IMPRESSION: 1. Moderate patellofemoral joint degenerative changes. 2. Mild medial tibiofemoral joint space narrowing. 3. Minimal lateral tibiofemoral joint degenerative changes. 4. Bones may be osteopenic."    Patient Stated Goals  "to be able to stretch and use her legs and knees better"    Currently in Pain?  Yes    Pain Score  3     Pain Location  Knee    Pain Orientation  Right;Left    Pain Descriptors / Indicators  Dull;Sharp    Pain Type  Acute pain    Pain Radiating Towards  right knee pain used to radiate at lateral thigh proximally towards hip and distally towards mid shin.    Pain Onset  More than a month ago    Pain Frequency  Constant    Aggravating Factors   standing up, walking, getting in and out of a car.     Pain Relieving Factors  medications, arthritis cream, leg sleeve, ice.     Effect of Pain on Daily  Activities  difficulty working, with prolonged standing, walking due to pain. Unable to do yoga exercises.             OBJECTIVE: OBSERVATION/INSPECTION: Patient presents with bilateral genuvalgus, R > L .  NEUROLOGICAL: Dermatomes: BLE intact to light touch . Myotomes: BLE WFL. SLR: R = positive bilaterally for posterior thigh pain (questionable, but sensitive to sensitizing maneuver, no reproduction of concordant symtpoms)  PERIPHERAL JOINT MOTION (AROM/PROM in degrees):  *Indicates pain Hip B = WFL with deficit in extension, right ER quite a bit less than left.  Knee - Flexion: R = 130/132* front and back ERP, L = 139/145 ERP..  - Extension: R = -12, L = -2. Pain in right knee when left to relax. Ankle R grossly WFL, left  limited all directions related to prior surgery.    STRENGTH:  *Indicates pain Hip  - Flexion: R = 4/5, L = 4/5. - Extension: R = 3/5, L = 3/5.limited ROM - Abduction: R = 4/5 pain at hand placement, L = 4/5. Knee - Ext: R = 4/5*, L = 4+/5. - Flex: R = 4/5*, L = 4+/5. Ankle (seated position) - Dorsiflexion: R = 4/5, L = 4/5. - Eversion: R = 4/5, L = 4/5.   REPEATED MOTIONS TESTING: - Prone lumbar extension press up x 10 (difficulty getting to end range due ot weakness in arms): during = no effect;   ACCESSORY MOTION:  - CPA to lumbar spine had no effect on symptoms. .  FUNCTIONAL MOBILITY: - Bed mobility: supine <> sit I - Transfers: sit <> stand I with no UE support - Gait: I for short community distances. Decreased left ankle dorsiflexion causes short right step length - Stairs: Mod I B UE support, step to gait pattern leading with right ascending and left descending.    Objective measurements completed on examination: See above findings.    TREATMENT: Therapeutic exercise: to centralize symptoms and improve ROM and strength required for successful completion of functional activities.  - Hooklying bridge x 10 - sidelying clamshell x 10 each side - seated quad set with towel roll under knee x 10, 5 second hold.  -Education on diagnosis, prognosis, POC, anatomy and physiology of current condition.  -Education on HEP including handout   HOME EXERCISE PROGRAM Access Code: [email or web address]  URL: [email or web address]  Date: 07/17/2018  Prepared by: Norton BlizzardSara Snyder   Exercises  Supine Bridge - 10-15 reps - 1 second hold - 3 Sets - 1x daily - 3x weekly  Clamshell - 10-15 reps - 1 second hold - 3 Sets - 1x daily - 3x weekly  Supine Quad Set on Towel Roll - 10-15 reps - 5 second hold - 3 Sets - 1x daily - 3x weekly   Patient response to treatment:  Pt tolerated treatment well. Pt was able to complete all exercises with minimal to no lasting increase in pain or  discomfort. Pt required cuing for proper technique and to facilitate improved neuromuscular control, strength, range of motion, and functional ability.    PT Education - 07/18/18 0740    Education Details  -Education on diagnosis, prognosis, POC, anatomy and physiology of current condition. -Education on HEP including handout. Cuing for correct completion of exercises. cuing to stay on task    Person(s) Educated  Patient    Methods  Explanation;Demonstration;Tactile cues;Verbal cues;Handout    Comprehension  Verbalized understanding;Returned demonstration       PT Short Term Goals - 07/18/18 [id number]  PT SHORT TERM GOAL #1   Title  Be independent with home exercise program completed at least 3 times per week for self-management of symptoms    Baseline  initial HEP provided at IE (07/17/2018)    Time  2    Period  Weeks    Status  New    Target Date  08/01/18        PT Long Term Goals - 07/18/18 0756      PT LONG TERM GOAL #1   Title  Be independent with a long-term home exercise program for self-management of symptoms.     Baseline  Initial HEP provided at IE (07/17/2018);    Time  8    Period  Weeks    Status  New    Target Date  09/11/18      PT LONG TERM GOAL #2   Title  Demonstrate improved FOTO score by 10 units to demonstrate improvement in overall condition and self-reported functional ability.     Baseline  46 (07/17/2018);    Time  8    Period  Weeks    Status  New    Target Date  09/11/18      PT LONG TERM GOAL #3   Title  Patient will increase BLE gross strength to 4+/5 as to improve functional strength for independent gait, increased standing tolerance.    Baseline  See objective exam (07/18/2018)    Time  8    Period  Weeks    Status  New    Target Date  09/11/18      PT LONG TERM GOAL #4   Title  Complete community, work and/or recreational activities without limitation due to current condition.     Baseline  Pain with work activities, stopped  fitness routine (07/17/2018);    Time  8    Period  Weeks    Status  New    Target Date  09/11/18      PT LONG TERM GOAL #5   Title  Be able to demonstrate floor to waist lift with proper form without limitation due to current condition in order to improve ability to lift during usual activities.     Baseline  Testing deferred due to pain (07/17/2018);     Time  8    Period  Weeks    Status  New    Target Date  09/11/18             Plan - 07/18/18 0750    Clinical Impression Statement  Patient is a 67 y.o. female referred to outpatient physical therapy with a diagnosis of chronic bilateral knee pain and osteoarthritis who presents with signs and symptoms consistent with bilateral knee pain, R > L, leading to difficulty with functional activities.     History and Personal Factors relevant to plan of care:  left foot surgery in 1984 (has remaninig stiffness in left ankle that has changed her gait pattern since then).    Clinical Presentation  Evolving    Clinical Presentation due to:  pt reports her knee pain is chainging in severity and she had a significant increase in pain less than 3 months ago    Clinical Decision Making  Low    Rehab Potential  Good    Clinical Impairments Affecting Rehab Potential  (+) motivation; (-) inability to come at reccomended frequency, ankle stiffness    PT Frequency  1x / week    PT Duration  8  weeks    PT Treatment/Interventions  ADLs/Self Care Home Management;Biofeedback;Cryotherapy;Moist Heat;Electrical Stimulation;Gait training;Stair training;Functional mobility training;Therapeutic activities;Therapeutic exercise;Balance training;Patient/family education;Manual techniques;Passive range of motion;Dry needling;Spinal Manipulations;Joint Manipulations;Other (comment)   joint mobilizations grades I-V   PT Next Visit Plan  Assess response to HEP and update as appropriate. Advance functional and LE strengthening as tolerated.     PT Home Exercise Plan   Medbridge Access Code: [email or web address]    Consulted and Agree with Plan of Care  Patient       Patient will benefit from skilled therapeutic intervention in order to improve the following deficits and impairments:  Abnormal gait, Decreased mobility, Decreased endurance, Decreased balance, Difficulty walking, Hypomobility, Impaired perceived functional ability, Decreased range of motion, Improper body mechanics, Pain, Postural dysfunction, Impaired flexibility, Decreased strength, Decreased activity tolerance  Visit Diagnosis: Right knee pain, unspecified chronicity - Plan: PT plan of care cert/re-cert  Left knee pain, unspecified chronicity - Plan: PT plan of care cert/re-cert  Difficulty in walking, not elsewhere classified - Plan: PT plan of care cert/re-cert     Problem List Patient Active Problem List   Diagnosis Date Noted  . Osteoarthritis of both knees 06/17/2018    Cira Rue, PT, DPT 07/18/2018, 8:04 AM  [place] [facility] [address], [place], [address] Phone: [phone]   Fax:  [phone]  Name: Joyce Estes MRN: [id number] Date of Birth: Oct 28, 1950

## 2018-07-23 ENCOUNTER — Ambulatory Visit: Payer: Medicare Other | Admitting: Physical Therapy

## 2018-07-29 ENCOUNTER — Ambulatory Visit: Payer: Medicare Other | Attending: Nurse Practitioner | Admitting: Physical Therapy

## 2018-07-29 ENCOUNTER — Encounter: Payer: Self-pay | Admitting: Physical Therapy

## 2018-07-29 DIAGNOSIS — M25561 Pain in right knee: Secondary | ICD-10-CM | POA: Insufficient documentation

## 2018-07-29 DIAGNOSIS — R262 Difficulty in walking, not elsewhere classified: Secondary | ICD-10-CM

## 2018-07-29 DIAGNOSIS — M25562 Pain in left knee: Secondary | ICD-10-CM | POA: Diagnosis not present

## 2018-07-29 NOTE — Therapy (Signed)
[facility] [address] [place], [address] Phone: [phone]   Fax:  [phone]  Physical Therapy Treatment  Patient Details  Name: Joyce Estes MRN: [id number] Date of Birth: 1950/11/07 Referring Provider (PT): Marjie Skiff, NP   Encounter Date: 07/29/2018  PT End of Session - 07/29/18 1537    Visit Number  2    Number of Visits  9    Date for PT Re-Evaluation  09/11/18    Authorization Type  [employer] Medicare    Authorization Time Period  Current cert period: 07/17/2018 - 09/11/2018 (last PN: IE 07/17/2018)    Authorization - Visit Number  2    Authorization - Number of Visits  9    PT Start Time  1535    PT Stop Time  1615    PT Time Calculation (min)  40 min    Activity Tolerance  Patient tolerated treatment well;Patient limited by pain    Behavior During Therapy  [facility] for tasks assessed/performed       History reviewed. No pertinent past medical history.  Past Surgical History:  Procedure Laterality Date  . FOOT SURGERY      There were no vitals filed for this visit.  Subjective Assessment - 07/29/18 1535    Subjective  Patient reports she is feeling well today. Had some pain this morning (4/10) after working yesterday but it has not been as bad as it has been. She took some OTC medication that helped and no longer has any pain upon arrival. States she has been doing her HEP without difficulty.     Pertinent History  Patient is a 67 y.o. female who presents to outpatient physical therapy with a referral for chronic pain of both knees; osteoarthritis to both knees with chronic pain, ongoing. This patient's chief complaints consist of bilateral knee pain R > L that started about 04/28/2018, stiffness, and reduced activity tolerance, leading to the following functional deficits: difficulty with work activities including walking, standing; discontinued exercise routine; difficulty with weight  bearing activities. Relevant past medical history and comorbidities include left foot surgery in 1984 (has remaninig stiffness in left ankle that has changed her gait pattern since then).  Imaging: Radiograph reports dated 07/02/2018: R knee = "IMPRESSION: 1. Moderate patellofemoral joint degenerative changes. 2. Mild medial and lateral tibiofemoral joint space narrowing. 3. Bones may be osteopenic." L knee = "IMPRESSION: 1. Moderate patellofemoral joint degenerative changes. 2. Mild medial tibiofemoral joint space narrowing. 3. Minimal lateral tibiofemoral joint degenerative changes. 4. Bones may be osteopenic."   work, fitness routine   Limitations  Standing;Walking;House hold activities;Other (comment)    How long can you sit comfortably?  unlimited    How long can you stand comfortably?  15 min    How long can you walk comfortably?  30 min    Diagnostic tests  Imaging: Radiograph reports dated 07/02/2018: R knee = "IMPRESSION: 1. Moderate patellofemoral joint degenerative changes. 2. Mild medial and lateral tibiofemoral joint space narrowing. 3. Bones may be osteopenic." L knee = "IMPRESSION: 1. Moderate patellofemoral joint degenerative changes. 2. Mild medial tibiofemoral joint space narrowing. 3. Minimal lateral tibiofemoral joint degenerative changes. 4. Bones may be osteopenic."    Patient Stated Goals  "to be able to stretch and use her legs and knees better"    Currently in Pain?  No/denies    Pain Onset  More than a month ago  PT Education - 07/29/18 1537    Education Details  form/purpose of exercise/activity    Person(s) Educated  Patient    Methods  Explanation;Demonstration;Tactile cues;Verbal cues    Comprehension  Verbalized understanding;Returned demonstration      OBJECTIVE:  Knee AROM/PROM  Flexion: R = 130/134*   Extension: R = -8  TREATMENT: Therapeutic exercise: to centralize symptoms and improve ROM and strength required for successful completion of  functional activities.  - Recumbent Bike no added resistance at 60 rpm until fatigued at about 3 min, then self-selected pace until last 40 seconds that was back to 60 rpm. Dyspnea limiting factor. For improved lower extremity ROM, muscular endurance, and activity tolerance; and to induce the analgesic effect of aerobic exercise, stimulate joint nutrition, and prepare body structures and systems for following interventions. X 5 min during subjective exam.  - Hooklying bridge x 16, with red theraband 2x16. Cuing for breathing and stronger glute activation, feet closer to heels.  - sidelying clamshell x 30 each side with red theraband, cuing to keep pelvis still. - seated quad set with towel roll under knee 2x10, 5 second hold. Extensive cuing for proper activation and relaxation.  - Long arc quad, 2x10 each side with 10# ankle weights, cuing for terminal knee extension. - Squats with BUE support focusing on glute activation and proper form with hip hinging, stabilized back, and tibial perpendicular to floor with knees behind toes. X 15.  - seated long arc quad from chair, trial of various ways to use red theraband for resistance so that pt can safely practice at home. Patient struggled with sequencing of tying band and required extensive cuing.  -Education on HEP including handout  and red theraband  HOME EXERCISE PROGRAM Access Code: [email or web address]  URL: [email or web address]  Date: 07/29/2018  Prepared by: Norton Blizzard   Exercises  Supine Bridge with Resistance Band - 10-15 reps - 1 second hold - 3 Sets - 1x daily - 3x weekly  Clamshell - 10-15 reps - 1 second hold - 3 Sets - 1x daily - 3x weekly  Clamshell with Resistance - 10-15 reps - 1 second hold - 3 Sets - 1x daily - 3x weekly  Supine Quad Set on Towel Roll - 10-15 reps - 5 second hold - 3 Sets - 1x daily - 3x weekly  Seated Knee Extension with Resistance - 10-15 reps - 1 second hold - 3 Sets - 1x daily - 3x weekly  Squat with  Chair and Counter Support - 10-15 reps - 1 second hold - 3 Sets - 1x daily - 3x weekly    Patient response to treatment:  Pt tolerated treatment well. Pt was able to complete all exercises including progressions with minimal to no lasting increase in pain or discomfort. Patient demonstrated improved knee extension with less pain. Patient's HEP was updated to reflect progressed exercises. She had diffiuclty with set up for long arc quad with theraband, so a simpler but less efficient exercise was selected for HEP. Pt required cuing for proper technique and to facilitate improved neuromuscular control, strength, range of motion, and functional ability. Patient is making progress towards goals at this point.   PT Short Term Goals - 07/29/18 1539      PT SHORT TERM GOAL #1   Title  Be independent with home exercise program completed at least 3 times per week for self-management of symptoms    Baseline  initial HEP provided at IE (07/17/2018)    Time  2  Period  Weeks    Status  Achieved    Target Date  07/29/18        PT Long Term Goals - 07/18/18 0756      PT LONG TERM GOAL #1   Title  Be independent with a long-term home exercise program for self-management of symptoms.     Baseline  Initial HEP provided at IE (07/17/2018);    Time  8    Period  Weeks    Status  New    Target Date  09/11/18      PT LONG TERM GOAL #2   Title  Demonstrate improved FOTO score by 10 units to demonstrate improvement in overall condition and self-reported functional ability.     Baseline  46 (07/17/2018);    Time  8    Period  Weeks    Status  New    Target Date  09/11/18      PT LONG TERM GOAL #3   Title  Patient will increase BLE gross strength to 4+/5 as to improve functional strength for independent gait, increased standing tolerance.    Baseline  See objective exam (07/18/2018)    Time  8    Period  Weeks    Status  New    Target Date  09/11/18      PT LONG TERM GOAL #4   Title  Complete  community, work and/or recreational activities without limitation due to current condition.     Baseline  Pain with work activities, stopped fitness routine (07/17/2018);    Time  8    Period  Weeks    Status  New    Target Date  09/11/18      PT LONG TERM GOAL #5   Title  Be able to demonstrate floor to waist lift with proper form without limitation due to current condition in order to improve ability to lift during usual activities.     Baseline  Testing deferred due to pain (07/17/2018);     Time  8    Period  Weeks    Status  New    Target Date  09/11/18            Plan - 07/29/18 1724    Clinical Impression Statement  Pt tolerated treatment well. Pt was able to complete all exercises including progressions with minimal to no lasting increase in pain or discomfort. Patient demonstrated improved knee extension with less pain. Patient's HEP was updated to reflect progressed exercises. She had diffiuclty with set up for long arc quad with theraband, so a simpler but less efficient exercise was selected for HEP. Pt required cuing for proper technique and to facilitate improved neuromuscular control, strength, range of motion, and functional ability.    Rehab Potential  Good    Clinical Impairments Affecting Rehab Potential  (+) motivation; (-) inability to come at reccomended frequency, ankle stiffness    PT Frequency  1x / week    PT Duration  8 weeks    PT Treatment/Interventions  ADLs/Self Care Home Management;Biofeedback;Cryotherapy;Moist Heat;Electrical Stimulation;Gait training;Stair training;Functional mobility training;Therapeutic activities;Therapeutic exercise;Balance training;Patient/family education;Manual techniques;Passive range of motion;Dry needling;Spinal Manipulations;Joint Manipulations;Other (comment)   joint mobilizations grades I-V   PT Next Visit Plan  Assess response to HEP and update as appropriate. Advance functional and LE strengthening as tolerated.     PT  Home Exercise Plan  Medbridge Access Code: [email or web address]    Consulted and Agree with Plan of Care  Patient  Patient will benefit from skilled therapeutic intervention in order to improve the following deficits and impairments:  Abnormal gait, Decreased mobility, Decreased endurance, Decreased balance, Difficulty walking, Hypomobility, Impaired perceived functional ability, Decreased range of motion, Improper body mechanics, Pain, Postural dysfunction, Impaired flexibility, Decreased strength, Decreased activity tolerance  Visit Diagnosis: Right knee pain, unspecified chronicity  Left knee pain, unspecified chronicity  Difficulty in walking, not elsewhere classified     Problem List Patient Active Problem List   Diagnosis Date Noted  . Osteoarthritis of both knees 06/17/2018    Cira Rue, PT, DPT 07/29/2018, 5:24 PM  [facility] PHYSICAL AND SPORTS MEDICINE [address], [place], [address] Phone: [phone]   Fax:  [phone]  Name: LADDIE NAEEM MRN: [id number] Date of Birth: [birth year]

## 2018-07-31 ENCOUNTER — Encounter: Payer: Medicare Other | Admitting: Physical Therapy

## 2018-08-05 ENCOUNTER — Encounter: Payer: Medicare Other | Admitting: Physical Therapy

## 2018-08-07 ENCOUNTER — Encounter: Payer: Self-pay | Admitting: Physical Therapy

## 2018-08-07 ENCOUNTER — Encounter: Payer: Medicare Other | Admitting: Physical Therapy

## 2018-08-07 ENCOUNTER — Ambulatory Visit: Payer: Medicare Other | Admitting: Physical Therapy

## 2018-08-07 DIAGNOSIS — R262 Difficulty in walking, not elsewhere classified: Secondary | ICD-10-CM | POA: Diagnosis not present

## 2018-08-07 DIAGNOSIS — M25562 Pain in left knee: Secondary | ICD-10-CM

## 2018-08-07 DIAGNOSIS — M25561 Pain in right knee: Secondary | ICD-10-CM | POA: Diagnosis not present

## 2018-08-07 NOTE — Therapy (Signed)
[facility] [facility] [address] [place], [address] Phone: [phone]   Fax:  [phone]  Physical Therapy Treatment  Patient Details  Name: Joyce Estes MRN: [id number] Date of Birth: Jul 14, 1951 Referring Provider (PT): Marjie Skiff, NP   Encounter Date: 08/07/2018  PT End of Session - 08/07/18 1352    Visit Number  3    Number of Visits  9    Date for PT Re-Evaluation  09/11/18    Authorization Type  [employer] Medicare    Authorization Time Period  Current cert period: 07/17/2018 - 09/11/2018 (last PN: IE 07/17/2018)    Authorization - Visit Number  3    Authorization - Number of Visits  9    PT Start Time  1125    PT Stop Time  1210    PT Time Calculation (min)  45 min    Activity Tolerance  Patient tolerated treatment well;Patient limited by pain    Behavior During Therapy  [facility] for tasks assessed/performed        History reviewed. No pertinent past medical history.  Past Surgical History:  Procedure Laterality Date  . FOOT SURGERY      There were no vitals filed for this visit.  Subjective Assessment - 08/07/18 1126    Subjective  Patient reports she is feeling well today with no pain upon arrival. She state the last time she had pain was this morning and it was "a little" 4/10 and she took tylenol. She states she had no excessive soreness or pain after last session. She states she feels like she is having slightly less pain at work now and it is becoming more tolerable. She states she is completing her HEP daily except on Saturday when she had a stomach ache. She wondders if this stomache ache is related to taking OTC pain meds since September. She reports she mostly takes Tylenol.     Pertinent History  Patient is a 67 y.o. female who presents to outpatient physical therapy with a referral for chronic pain of both knees; osteoarthritis to both knees with chronic pain, ongoing. This patient's chief  complaints consist of bilateral knee pain R > L that started about 04/28/2018, stiffness, and reduced activity tolerance, leading to the following functional deficits: difficulty with work activities including walking, standing; discontinued exercise routine; difficulty with weight bearing activities. Relevant past medical history and comorbidities include left foot surgery in 1984 (has remaninig stiffness in left ankle that has changed her gait pattern since then).  Imaging: Radiograph reports dated 07/02/2018: R knee = "IMPRESSION: 1. Moderate patellofemoral joint degenerative changes. 2. Mild medial and lateral tibiofemoral joint space narrowing. 3. Bones may be osteopenic." L knee = "IMPRESSION: 1. Moderate patellofemoral joint degenerative changes. 2. Mild medial tibiofemoral joint space narrowing. 3. Minimal lateral tibiofemoral joint degenerative changes. 4. Bones may be osteopenic."   work, fitness routine   Limitations  Standing;Walking;House hold activities;Other (comment)    How long can you sit comfortably?  unlimited    How long can you stand comfortably?  15 min    How long can you walk comfortably?  30 min    Diagnostic tests  Imaging: Radiograph reports dated 07/02/2018: R knee = "IMPRESSION: 1. Moderate patellofemoral joint degenerative changes. 2. Mild medial and lateral tibiofemoral joint space narrowing. 3. Bones may be osteopenic." L knee = "IMPRESSION: 1. Moderate patellofemoral joint degenerative changes. 2. Mild medial tibiofemoral joint space narrowing. 3. Minimal lateral  tibiofemoral joint degenerative changes. 4. Bones may be osteopenic."    Patient Stated Goals  "to be able to stretch and use her legs and knees better"    Currently in Pain?  No/denies    Pain Onset  More than a month ago       PT Education - 08/07/18 1130    Education Details  Was advised to discuss medication concerns with MD and educated some about NSAID vs Tylenol side effects. cuing for exercise  form/purpose.  edu on self management.     Person(s) Educated  Patient    Methods  Explanation;Demonstration;Tactile cues;Verbal cues    Comprehension  Verbalized understanding;Returned demonstration      OBJECTIVE:  Knee AROM/PROM  Flexion: R =130/137*   Extension: R =-8/-5  TREATMENT: Therapeutic exercise:to centralize symptoms and improve ROM and strength required for successful completion of functional activities.  - Recumbent Bike no added resistance at 60 rpm. For improved lower extremity ROM, muscular endurance, and activity tolerance; and to induce the analgesic effect of aerobic exercise, stimulate joint nutrition, and prepare body structures and systems for following interventions. X 5 min during subjective exam.  - Hooklying hip thrust with round bolster behind shoulders and stablized by clinician, x10 Cuing for breathing and stronger glute activation, head position.  - very short arc quad foam roll under knees and 10# ankle weight applied, 5 second hold. X 3 min plus time for appropriate rest and transition each side.  - short arc quad round bolster under knees and 10# ankle weight applied, 5 second hold. X 2 min plus time for appropriate rest and transition each side.  - Long arc quad, 10# ankle weight applied, 5 second hold. X 2 min plus time for appropriate rest and transition each side.  - Sit <> stand 2x10 with band around knees and cuing for knee alignment. 2x10.  - step ups with unilateral UE support to 6 inch step x 10 each side.   -Education on HEP including handout and red theraband  Manual therapy: to reduce pain and tissue tension, improve range of motion, neuromodulation, in order to promote improved ability to complete functional activities. - STM with foam roll assist over right quad muscles (increased flexion ROM following).  - R knee extension overpressure x 10 (increased extension ROM)  HOME EXERCISE PROGRAM Access Code: [email or web address]  URL:  [email or web address]  Date: 08/07/2018  Prepared by: Norton BlizzardSara Rosealie Reach   Exercises  Supine Bridge with Resistance Band - 10-15 reps - 1 second hold - 3 Sets - 1x daily - 3x weekly  Clamshell with Resistance - 10-15 reps - 1 second hold - 3 Sets - 1x daily - 3x weekly  Seated Knee Extension with Resistance - 10-15 reps - 1 second hold - 3 Sets - 1x daily - 3x weekly  Sit to Stand without Arm Support - 5 - 10 reps - 1 second hold - 3 Sets - 1x daily - 3x weekly  Step Up - 10-15 reps - 1 second hold - 3 Sets - 1x daily - 3x weekly      Patient response to treatment:  Pt tolerated treatment well. Pt was able to complete all exercises including progressions with no complaints of pain or discomfort. Knee popping present occasionally but painless. Patient demonstrated improved knee extension with less pain and improved tolerance for weight bearing exercises. Patient's HEP was updated to reflect progressed exercises. Pt required cuing for proper technique and to facilitate improved neuromuscular control, strength, range of  motion, and functional ability. Patient is making progress towards goals at this point.   PT Short Term Goals - 07/29/18 1539      PT SHORT TERM GOAL #1   Title  Be independent with home exercise program completed at least 3 times per week for self-management of symptoms    Baseline  initial HEP provided at IE (07/17/2018)    Time  2    Period  Weeks    Status  Achieved    Target Date  07/29/18        PT Long Term Goals - 07/18/18 0756      PT LONG TERM GOAL #1   Title  Be independent with a long-term home exercise program for self-management of symptoms.     Baseline  Initial HEP provided at IE (07/17/2018);    Time  8    Period  Weeks    Status  New    Target Date  09/11/18      PT LONG TERM GOAL #2   Title  Demonstrate improved FOTO score by 10 units to demonstrate improvement in overall condition and self-reported functional ability.     Baseline   46 (07/17/2018);    Time  8    Period  Weeks    Status  New    Target Date  09/11/18      PT LONG TERM GOAL #3   Title  Patient will increase BLE gross strength to 4+/5 as to improve functional strength for independent gait, increased standing tolerance.    Baseline  See objective exam (07/18/2018)    Time  8    Period  Weeks    Status  New    Target Date  09/11/18      PT LONG TERM GOAL #4   Title  Complete community, work and/or recreational activities without limitation due to current condition.     Baseline  Pain with work activities, stopped fitness routine (07/17/2018);    Time  8    Period  Weeks    Status  New    Target Date  09/11/18      PT LONG TERM GOAL #5   Title  Be able to demonstrate floor to waist lift with proper form without limitation due to current condition in order to improve ability to lift during usual activities.     Baseline  Testing deferred due to pain (07/17/2018);     Time  8    Period  Weeks    Status  New    Target Date  09/11/18            Plan - 08/07/18 1402    Clinical Impression Statement  Pt tolerated treatment well. Pt was able to complete all exercises including progressions with no complaints of pain or discomfort. Knee popping present occasionally but painless. Patient demonstrated improved knee extension with less pain and improved tolerance for weight bearing exercises. Patient's HEP was updated to reflect progressed exercises. Pt required cuing for proper technique and to facilitate improved neuromuscular control, strength, range of motion, and functional ability. Patient is making progress towards goals at this point.     Rehab Potential  Good    Clinical Impairments Affecting Rehab Potential  (+) motivation; (-) inability to come at reccomended frequency, ankle stiffness    PT Frequency  1x / week    PT Duration  8 weeks    PT Treatment/Interventions  ADLs/Self Care Home Management;Biofeedback;Cryotherapy;Moist Heat;Electrical  Stimulation;Gait training;Stair  training;Functional mobility training;Therapeutic activities;Therapeutic exercise;Balance training;Patient/family education;Manual techniques;Passive range of motion;Dry needling;Spinal Manipulations;Joint Manipulations;Other (comment)   joint mobilizations grades I-V   PT Next Visit Plan  Assess response to HEP and update as appropriate. Advance functional and LE strengthening as tolerated. CKC strengthening    PT Home Exercise Plan  Medbridge Access Code: [email or web address]    Consulted and Agree with Plan of Care  Patient       Patient will benefit from skilled therapeutic intervention in order to improve the following deficits and impairments:  Abnormal gait, Decreased mobility, Decreased endurance, Decreased balance, Difficulty walking, Hypomobility, Impaired perceived functional ability, Decreased range of motion, Improper body mechanics, Pain, Postural dysfunction, Impaired flexibility, Decreased strength, Decreased activity tolerance  Visit Diagnosis: Right knee pain, unspecified chronicity  Left knee pain, unspecified chronicity  Difficulty in walking, not elsewhere classified     Problem List Patient Active Problem List   Diagnosis Date Noted  . Osteoarthritis of both knees 06/17/2018    Cira Rue, PT, DPT 08/07/2018, 2:04 PM  [place] [facility] PHYSICAL AND SPORTS MEDICINE [address], [place], [address] Phone: [phone]   Fax:  [phone]  Name: CATARINA HUNTLEY MRN: [id number] Date of Birth: 26-Nov-1950

## 2018-08-12 ENCOUNTER — Encounter: Payer: Medicare Other | Admitting: Physical Therapy

## 2018-08-14 ENCOUNTER — Ambulatory Visit: Payer: Medicare Other | Admitting: Physical Therapy

## 2018-08-14 ENCOUNTER — Encounter: Payer: Medicare Other | Admitting: Physical Therapy

## 2018-08-14 ENCOUNTER — Encounter: Payer: Self-pay | Admitting: Physical Therapy

## 2018-08-14 DIAGNOSIS — M25562 Pain in left knee: Secondary | ICD-10-CM

## 2018-08-14 DIAGNOSIS — R262 Difficulty in walking, not elsewhere classified: Secondary | ICD-10-CM | POA: Diagnosis not present

## 2018-08-14 DIAGNOSIS — M25561 Pain in right knee: Secondary | ICD-10-CM | POA: Diagnosis not present

## 2018-08-14 NOTE — Therapy (Signed)
[facility] [address], [place], [address] Phone: [phone]   Fax:  [phone]  Physical Therapy Treatment  Patient Details  Name: Joyce Estes MRN: [id number] Date of Birth: 03-12-1951 Referring Provider (PT): Marjie Skiff, NP   Encounter Date: 08/14/2018  PT End of Session - 08/14/18 1535    Visit Number  4    Number of Visits  9    Date for PT Re-Evaluation  09/11/18    Authorization Type  [employer]    Authorization Time Period  Current cert period: 07/17/2018 - 09/11/2018 (last PN: [facility] 07/17/2018)    Authorization - Visit Number  4    Authorization - Number of Visits  9    PT Start Time  1430    PT Stop Time  1520    PT Time Calculation (min)  50 min    Activity Tolerance  Patient tolerated treatment well;Patient limited by fatigue    Behavior During Therapy  [facility] for tasks assessed/performed       History reviewed. No pertinent past medical history.  Past Surgical History:  Procedure Laterality Date  . FOOT SURGERY      There were no vitals filed for this visit.  Subjective Assessment - 08/14/18 1432    Subjective  Patient reports she is feeling well and has no pain upon arrival. She states her pain is improving overall and now she just gets pain if she has an extra long day standing at work. She has been completing her HEP about every other day as prescribed. She states they were somewhat difficult at first but became easier as she kept doing them. Reports no excessive sorness or pain following last treatment session.     Pertinent History  Patient is a 67 y.o. female who presents to outpatient physical therapy with a referral for chronic pain of both knees; osteoarthritis to both knees with chronic pain, ongoing. This patient's chief complaints consist of bilateral knee pain R > L that started about 04/28/2018, stiffness, and reduced activity tolerance, leading to the following  functional deficits: difficulty with work activities including walking, standing; discontinued exercise routine; difficulty with weight bearing activities. Relevant past medical history and comorbidities include left foot surgery in 1984 (has remaninig stiffness in left ankle that has changed her gait pattern since then).  Imaging: Radiograph reports dated 07/02/2018: R knee = "IMPRESSION: 1. Moderate patellofemoral joint degenerative changes. 2. Mild medial and lateral tibiofemoral joint space narrowing. 3. Bones may be osteopenic." L knee = "IMPRESSION: 1. Moderate patellofemoral joint degenerative changes. 2. Mild medial tibiofemoral joint space narrowing. 3. Minimal lateral tibiofemoral joint degenerative changes. 4. Bones may be osteopenic."   work, fitness routine   Limitations  Standing;Walking;House hold activities;Other (comment)    How long can you sit comfortably?  unlimited    How long can you stand comfortably?  15 min    How long can you walk comfortably?  30 min    Diagnostic tests  Imaging: Radiograph reports dated 07/02/2018: R knee = "IMPRESSION: 1. Moderate patellofemoral joint degenerative changes. 2. Mild medial and lateral tibiofemoral joint space narrowing. 3. Bones may be osteopenic." L knee = "IMPRESSION: 1. Moderate patellofemoral joint degenerative changes. 2. Mild medial tibiofemoral joint space narrowing. 3. Minimal lateral tibiofemoral joint degenerative changes. 4. Bones may be osteopenic."    Patient Stated Goals  "to be able to stretch and use her legs and knees better"  Currently in Pain?  No/denies    Pain Onset  More than a month ago         PT Education - 08/14/18 1535    Education Details  cuing for form and explanation of rationale behind exercises. Advice for self management and progressions to HEP    Person(s) Educated  Patient    Methods  Explanation;Demonstration;Tactile cues;Verbal cues    Comprehension  Verbalized understanding;Returned demonstration        OBJECTIVE: KneeAROM/PROM (after manual)  Flexion: R =135/142* (last measured 08/14/2018)  Extension: R =-5/0* (last measured 08/14/2018)  TREATMENT: Therapeutic exercise:to centralize symptoms and improve ROM and strength required for successful completion of functional activities. -Recumbent Bikeno added resistance at 60 rpm. Seat position 6. For improved lower extremity ROM, muscular endurance, and activity tolerance; and to induce the analgesic effect of aerobic exercise, stimulate joint nutrition, and prepare body structures and systems for following interventions.X 5 min during subjective exam.  - Hooklying hip thrust with round bolster behind shoulders and stablized by clinician, 2x10 Cuing for breathing and stronger glute activation, head position.  - very short arc quad foam roll under knees and 10# ankle weight applied, 5 second hold. X 2 min plus time for appropriate rest and transition each side.  - short arc quad round bolster under knees and 10# ankle weight applied, 5 second hold. X 2 min plus time for appropriate rest and transition each side.  - Long arc quad, 10# ankle weight applied, 5 second hold. X 2 min plus time for appropriate rest and transition each side.  Therapeutic activities: for functional strengthening and improved functional activity tolerance.  -Sit <> stand from 18" chair with green theraband band around knees and cuing for knee alignment. No UE support. 2x10.  - step ups with unilateral UE support to 6 inch step 2x 10 each side.   Manual therapy: to reduce pain and tissue tension, improve range of motion, neuromodulation, in order to promote improved ability to complete functional activities. Improved ROM following. No lasting discomfort.  - STM with foam roll assist over right quad muscles - Tibiofemoral joint mobilizations grades II-IV, supine AP with foam roll behind femur, hooklying PA and tibial ER, flexion over therapist forearm, ~  2x 10 oscillations each position to improve ROM and decrease pain.  - R knee extension overpressure grade III-IV, 2x10 to tolerance  HOME EXERCISE PROGRAM Access Code: [email or web address]  URL: [email or web address]  Date: 08/07/2018  Prepared by: Norton BlizzardSara Snyder   Exercises   Supine Bridge with Resistance Band - 10-15 reps - 1 second hold - 3 Sets - 1x daily - 3x weekly   Clamshell with Resistance - 10-15 reps - 1 second hold - 3 Sets - 1x daily - 3x weekly   Seated Knee Extension with Resistance - 10-15 reps - 1 second hold - 3 Sets - 1x daily - 3x weekly   Sit to Stand without Arm Support - 5 - 10 reps - 1 second hold - 3 Sets - 1x daily - 3x weekly   Step Up - 10-15 reps - 1 second hold - 3 Sets - 1x daily - 3x weekly    Patient response to treatment:  Pt tolerated treatment well. Pt was able to complete all exercisesincluding progressionsto more reps with no complaints of pain or discomfort. Patient continues to improve in knee extension and flexion with less pain and improved tolerance for weight bearing exercises. Patient was advised to add reps or set to HEP  for progressions of exercises that become easy. Pt required cuing for proper technique and to facilitate improved neuromuscular control, strength, range of motion, and functional ability.Pt required assistance with counting and guarding for standing activities. Patient is making progress towards goals at this point.  PT Short Term Goals - 07/29/18 1539      PT SHORT TERM GOAL #1   Title  Be independent with home exercise program completed at least 3 times per week for self-management of symptoms    Baseline  initial HEP provided at IE (07/17/2018)    Time  2    Period  Weeks    Status  Achieved    Target Date  07/29/18        PT Long Term Goals - 07/18/18 0756      PT LONG TERM GOAL #1   Title  Be independent with a long-term home exercise program for self-management of symptoms.     Baseline  Initial HEP  provided at IE (07/17/2018);    Time  8    Period  Weeks    Status  New    Target Date  09/11/18      PT LONG TERM GOAL #2   Title  Demonstrate improved FOTO score by 10 units to demonstrate improvement in overall condition and self-reported functional ability.     Baseline  46 (07/17/2018);    Time  8    Period  Weeks    Status  New    Target Date  09/11/18      PT LONG TERM GOAL #3   Title  Patient will increase BLE gross strength to 4+/5 as to improve functional strength for independent gait, increased standing tolerance.    Baseline  See objective exam (07/18/2018)    Time  8    Period  Weeks    Status  New    Target Date  09/11/18      PT LONG TERM GOAL #4   Title  Complete community, work and/or recreational activities without limitation due to current condition.     Baseline  Pain with work activities, stopped fitness routine (07/17/2018);    Time  8    Period  Weeks    Status  New    Target Date  09/11/18      PT LONG TERM GOAL #5   Title  Be able to demonstrate floor to waist lift with proper form without limitation due to current condition in order to improve ability to lift during usual activities.     Baseline  Testing deferred due to pain (07/17/2018);     Time  8    Period  Weeks    Status  New    Target Date  09/11/18            Plan - 08/14/18 1538    Clinical Impression Statement  Pt tolerated treatment well. Pt was able to complete all exercisesincluding progressionsto more reps with no complaints of pain or discomfort. Patient continues to improve in knee extension and flexion with less pain and improved tolerance for weight bearing exercises. Patient was advised to add reps or set to HEP for progressions of exercises that become easy. Pt required cuing for proper technique and to facilitate improved neuromuscular control, strength, range of motion, and functional ability.Pt required assistance with counting and guarding for standing activities.  Patient is making progress towards goals at this point.    Rehab Potential  Good    Clinical  Impairments Affecting Rehab Potential  (+) motivation; (-) inability to come at reccomended frequency, ankle stiffness    PT Frequency  1x / week    PT Duration  8 weeks    PT Treatment/Interventions  ADLs/Self Care Home Management;Biofeedback;Cryotherapy;Moist Heat;Electrical Stimulation;Gait training;Stair training;Functional mobility training;Therapeutic activities;Therapeutic exercise;Balance training;Patient/family education;Manual techniques;Passive range of motion;Dry needling;Spinal Manipulations;Joint Manipulations;Other (comment)   joint mobilizations grades I-V   PT Next Visit Plan  Assess response to HEP and update as appropriate. Advance functional and LE strengthening as tolerated. continue CKC strengthening    PT Home Exercise Plan  Medbridge Access Code: [email or web address]    Consulted and Agree with Plan of Care  Patient       Patient will benefit from skilled therapeutic intervention in order to improve the following deficits and impairments:  Abnormal gait, Decreased mobility, Decreased endurance, Decreased balance, Difficulty walking, Hypomobility, Impaired perceived functional ability, Decreased range of motion, Improper body mechanics, Pain, Postural dysfunction, Impaired flexibility, Decreased strength, Decreased activity tolerance  Visit Diagnosis: Right knee pain, unspecified chronicity  Left knee pain, unspecified chronicity  Difficulty in walking, not elsewhere classified     Problem List Patient Active Problem List   Diagnosis Date Noted  . Osteoarthritis of both knees 06/17/2018    Cira Rue, PT, DPT 08/14/2018, 3:38 PM  [place] [facility] SPORTS [facility]IC[facility] [address], [place], [address] Phone: [phone]   Fax:  [phone]  Name: Joyce Estes MRN: [id number] Date of Birth: 02/15/1951

## 2018-08-19 ENCOUNTER — Encounter: Payer: Medicare Other | Admitting: Physical Therapy

## 2018-08-22 ENCOUNTER — Ambulatory Visit: Payer: Medicare Other | Admitting: Physical Therapy

## 2018-08-29 ENCOUNTER — Ambulatory Visit: Payer: Medicare Other | Admitting: Physical Therapy

## 2018-09-04 ENCOUNTER — Ambulatory Visit: Payer: Medicare Other | Attending: Nurse Practitioner | Admitting: Physical Therapy

## 2018-09-04 ENCOUNTER — Encounter: Payer: Self-pay | Admitting: Physical Therapy

## 2018-09-04 DIAGNOSIS — R262 Difficulty in walking, not elsewhere classified: Secondary | ICD-10-CM | POA: Insufficient documentation

## 2018-09-04 DIAGNOSIS — M25562 Pain in left knee: Secondary | ICD-10-CM | POA: Diagnosis not present

## 2018-09-04 DIAGNOSIS — M25561 Pain in right knee: Secondary | ICD-10-CM | POA: Insufficient documentation

## 2018-09-04 NOTE — Therapy (Signed)
[facility] [address], [place], [address] Phone: [phone]   Fax:  [phone]  Physical Therapy Treatment / Progress Note / Re - Certification Reporting Period:  07/17/2018 - 09/04/2018  Patient Details  Name: Joyce Estes MRN: [id number] Date of Birth: 11-05-1950 Referring Provider (PT): Venita Lick, NP   Encounter Date: 09/04/2018  PT End of Session - 09/04/18 1356    Visit Number  5    Number of Visits  9    Date for PT Re-Evaluation  10/02/18    Authorization Type  [employer] Medicare    Authorization Time Period  Current cert period: 01/27/3761 - 10/02/2018 (last PN: [facility] 09/04/2018)    Authorization - Visit Number  1    Authorization - Number of Visits  9    PT Start Time  1300    PT Stop Time  1345    PT Time Calculation (min)  45 min    Activity Tolerance  Patient tolerated treatment well;Patient limited by fatigue    Behavior During Therapy  [facility] for tasks assessed/performed       History reviewed. No pertinent past medical history.  Past Surgical History:  Procedure Laterality Date  . FOOT SURGERY      There were no vitals filed for this visit.  Subjective Assessment - 09/04/18 1302    Subjective  Patient reports her knee is a bit stiff this afternoon upon arrival, but she has no pain. She states her knees feel better when working, only up to 2-3/10 at the end of her shift. She states she felt good after her last physical therapy session. She states her HEP is going well. Some she does every other day. She has no questions. She feels strongly that coming to physical therapy has helped her. She states if PT helps her condition, she would rather do it than lots of medications. She only takes over the counter medication for pain at this point. She is starting some medication for osteoporosis.  Patient reports she would like to continue working in the clinic for another 2-4 weeks as she is still  having a bit of pain and she is not quite confident in her ability to continue independently.     Pertinent History  Patient is a 68 y.o. female who presents to outpatient physical therapy with a referral for chronic pain of both knees; osteoarthritis to both knees with chronic pain, ongoing. This patient's chief complaints consist of bilateral knee pain R > L that started about 04/28/2018, stiffness, and reduced activity tolerance, leading to the following functional deficits: difficulty with work activities including walking, standing; discontinued exercise routine; difficulty with weight bearing activities. Relevant past medical history and comorbidities include left foot surgery in 1984 (has remaninig stiffness in left ankle that has changed her gait pattern since then).  Imaging: Radiograph reports dated 07/02/2018: R knee = "IMPRESSION: 1. Moderate patellofemoral joint degenerative changes. 2. Mild medial and lateral tibiofemoral joint space narrowing. 3. Bones may be osteopenic." L knee = "IMPRESSION: 1. Moderate patellofemoral joint degenerative changes. 2. Mild medial tibiofemoral joint space narrowing. 3. Minimal lateral tibiofemoral joint degenerative changes. 4. Bones may be osteopenic."   work, fitness routine   Limitations  Standing;Walking;House hold activities;Other (comment)    How long can you sit comfortably?  unlimited    How long can you stand comfortably?  4 hours    How long can you walk comfortably?  30 min  Diagnostic tests  Imaging: Radiograph reports dated 07/02/2018: R knee = "IMPRESSION: 1. Moderate patellofemoral joint degenerative changes. 2. Mild medial and lateral tibiofemoral joint space narrowing. 3. Bones may be osteopenic." L knee = "IMPRESSION: 1. Moderate patellofemoral joint degenerative changes. 2. Mild medial tibiofemoral joint space narrowing. 3. Minimal lateral tibiofemoral joint degenerative changes. 4. Bones may be osteopenic."    Patient Stated Goals  "to be able  to stretch and use her legs and knees better"    Currently in Pain?  No/denies    Pain Onset  More than a month ago         [facility] PT Assessment - 09/04/18 0001      Assessment   Medical Diagnosis  Chronic pain of both knees; Osteoarthritis to both knees with chronic pain, ongoing.    Referring Provider (PT)  Venita Lick, NP    Onset Date/Surgical Date  04/28/18    Next MD Visit  10/03/2018    Prior Therapy  good response to current episode of care with reduced pain with functional activities, improved ROM and strength, and decreased functional disability      Precautions   Precautions  --      Restrictions   Weight Bearing Restrictions  No      Balance Screen   Has the patient fallen in the past 6 months  No    Has the patient had a decrease in activity level because of a fear of falling?   No    Is the patient reluctant to leave their home because of a fear of falling?   No      Home Social worker  Private residence    Living Arrangements  Other relatives;Other (Comment)   brother but rarely sees him   Type of Pump Back to enter    Entrance Stairs-Number of Steps  2-3    Entrance Stairs-Rails  Right;Left;Can reach both    Home Layout  One level    Home Equipment  None      Prior Function   Level of Independence  Independent   was able to do yoga-like exercises, work without pain   Vocation  Part time employment    Vocation Requirements  works part time at long term care facility (standing and walking, stands doing dishes for 1 hour after walking back and forth 2 hours in the wings at the long term care facility. Her work shifts ar 4 hours).    Leisure  Reading, yoga-like exercises intermittently.       Cognition   Overall Cognitive Status  Within Functional Limits for tasks assessed      Observation/Other Assessments   Observations  see note from 09/04/2018 for latest objective measurements    Focus on Therapeutic  Outcomes (FOTO)   65        OBJECTIVE: OBSERVATION/INSPECTION: Patient presents with bilateral genuvalgus, R > L .  PERIPHERAL JOINT MOTION (AROM/PROM in degrees):  *Indicates pain  Knee  Flexion: R = 137/142* front ERP, L = 145/150 ERP.  Extension: R = -9, L = -2.  STRENGTH:  *Indicates pain Hip         Flexion: R = 4+/5, L = 5/5.  Extension: R = 4/5, L = 4/5.limited ROM  Abduction: R = 4/5, L = 4/5. Knee  Ext: R = 5-/5, L = 5/5.  Flex: R = 5-/5, L = 5/5. Ankle (seated position)  Dorsiflexion: R = 4+/5, L = 4+/5.  Eversion: R = 4+5, L = 4+/5.  FUNCTIONAL MOBILITY:  Bed mobility: supine <> sit and rolling independent   Transfers: sit <> stand I with no UE support  Gait: I for short community distances. Decreased left ankle dorsiflexion causes short right step length and left toe walking, bilateral knee flexion in stance phase.   Stairs: Mod I B UE support, step to gait pattern leading with right ascending and left descending.   Floor to waist lift x 2 at 9.5# box, used hip dominant strategy with good body mechanics within ROM restriction limitations.   Objective measurements completed on examination: See above findings.     TREATMENT: Therapeutic exercise:to centralize symptoms and improve ROM and strength required for successful completion of functional activities. -Recumbent Bikeno added resistance at 60 rpm.Seat position 4. For improved lower extremity ROM, muscular endurance, and activity tolerance; and to induce the analgesic effect of aerobic exercise, stimulate joint nutrition, and prepare body structures and systems for following interventions.X 6 min during subjective exam. Became quite winded.    - examination to assess progress (see above). - Hooklyinghip thrust with round bolster behind shoulders and stablized by clinician,2x10Cuing for breathing and stronger glute activation, head position. -short arc quad round bolster under  knees and 10# ankle weight applied, 5 second hold.X 2 min plus time for appropriate rest and transition each side. - Long arc quad,10# ankle weight applied, 5 second hold.X 2 min plus time for appropriate rest and transition each side. Floor to waist lift x 2 at 9.5# box, used hip dominant strategy with good body mechanics within ROM restriction limitations. Education on Designer, fashion/clothing.  - Education on diagnosis, prognosis, POC, anatomy and physiology of current condition.   Manual therapy:to reduce pain and tissue tension, improve range of motion, neuromodulation, in order to promote improved ability to complete functional activities. Improved ROM following. No lasting discomfort.  - STM with foam roll assist over right quad muscles - Tibiofemoral joint mobilizations grades II-IV, supine AP with foam roll behind femur, hooklying PA and tibial ER, flexion over therapist forearm, ~ 2x 10 oscillations each position to improve ROM and decrease pain.   - R knee extension overpressure grade III-IV, 2x10 to tolerance   HOME EXERCISE PROGRAM Access Code: [email or web address]  URL: [email or web address]  Date: 08/07/2018  Prepared by: Rosita Kea   Exercises   Supine Bridge with Resistance Band - 10-15 reps - 1 second hold - 3 Sets - 1x daily - 3x weekly   Clamshell with Resistance - 10-15 reps - 1 second hold - 3 Sets - 1x daily - 3x weekly   Seated Knee Extension with Resistance - 10-15 reps - 1 second hold - 3 Sets - 1x daily - 3x weekly   Sit to Stand without Arm Support - 5 - 10 reps - 1 second hold - 3 Sets - 1x daily - 3x weekly   Step Up - 10-15 reps - 1 second hold - 3 Sets - 1x daily - 3x weekly   Patient response to treatment:  Pt tolerated treatment well. Pt was able to complete all exercisesincluding progressionsto more reps withno complaints of pain or discomfort. Patient continues to improve in knee extension and flexion with less pain. She did not complete  many weight bearing exercises today due to needing more time for assessment. It has been approx 3 weeks since pt's last visit and she does not appear to have regressed in her  primary outcomes. She did demonstrate slightly reduced cardiorespiratory endurance during warmup. Ptrequired cuing for proper technique and to facilitate improved neuromuscular control, strength, range of motion, and functional ability.Pt required assistance with counting. Patient is making progress towards goals at this point.  PT Education - 09/04/18 1418    Education Details  cuing for form/purpose of exercise. self-management advice. POC     Person(s) Educated  Patient    Methods  Explanation;Demonstration;Tactile cues;Verbal cues    Comprehension  Verbalized understanding;Returned demonstration       PT Short Term Goals - 07/29/18 1539      PT SHORT TERM GOAL #1   Title  Be independent with home exercise program completed at least 3 times per week for self-management of symptoms    Baseline  initial HEP provided at IE (07/17/2018)    Time  2    Period  Weeks    Status  Achieved    Target Date  07/29/18        PT Long Term Goals - 09/04/18 1317      PT LONG TERM GOAL #1   Title  Be independent with a long-term home exercise program for self-management of symptoms.     Baseline  Initial HEP provided at [facility] (07/17/2018); performing at this point but has not yet received final  long term HEP (09/04/2018)    Time  4    Period  Weeks    Status  Partially Met    Target Date  10/02/18      PT LONG TERM GOAL #2   Title  Demonstrate improved FOTO score by 10 units to demonstrate improvement in overall condition and self-reported functional ability.     Baseline  46 (07/17/2018); 65 (09/04/2018)    Time  8    Period  Weeks    Status  Achieved    Target Date  09/11/18      PT LONG TERM GOAL #3   Title  Patient will increase BLE gross strength to 4+/5 as to improve functional strength for independent gait,  increased standing tolerance.    Baseline  See objective exam (07/18/2018): met except 4/5 bilateral hip abduction and extension (09/04/2018)    Time  4    Period  Weeks    Status  Partially Met    Target Date  10/02/18      PT LONG TERM GOAL #4   Title  Complete community, work and/or recreational activities without limitation due to current condition.     Baseline  Pain with work activities, stopped fitness routine (07/17/2018); has started back to fitness routine, work activities are getting easier but still has pain by end of shift (09/04/2018)    Time  8    Period  Weeks    Status  Partially Met    Target Date  10/02/18      PT LONG TERM GOAL #5   Title  Be able to demonstrate floor to waist lift with proper form without limitation due to current condition in order to improve ability to lift during usual activities.     Baseline  Testing deferred due to pain (07/17/2018); able to perform floor to waist lift with 9.5# box with good form x 2 (09/04/2018);    Time  8    Period  Weeks    Status  Achieved    Target Date  09/11/18            Plan - 09/04/18 1423  Clinical Impression Statement  Patient has attended 5 physical therapy treatment sessions this episode of care. She has made steady progress towards goals and has demonstrated good ability to keep up with her HEP. She has improved in ROM, strength, pain level, FOTO score (self-reported function), and activity tolerance both reported and demonstrated in clinic. She continues to have pain at the end of her shifts and at end range PROM and is not fully confident in her ability to maintain her HEP independently long term at this point. She would benefit from continued physical therapy interventions to address remaining impairments and functional limitations.     Rehab Potential  Good    Clinical Impairments Affecting Rehab Potential  (+) motivation; (-) inability to come at reccomended frequency, ankle stiffness    PT Frequency  1x  / week    PT Duration  4 weeks    PT Treatment/Interventions  ADLs/Self Care Home Management;Biofeedback;Cryotherapy;Moist Heat;Electrical Stimulation;Gait training;Stair training;Functional mobility training;Therapeutic activities;Therapeutic exercise;Balance training;Patient/family education;Manual techniques;Passive range of motion;Dry needling;Spinal Manipulations;Joint Manipulations;Other (comment)   joint mobilizations grades I-V   PT Next Visit Plan  Assess response to HEP and update as appropriate. Advance functional and LE strengthening as tolerated. continue CKC strengthening.     PT Home Exercise Plan  Medbridge Access Code: [email or web address]    Consulted and Agree with Plan of Care  Patient       Patient will benefit from skilled therapeutic intervention in order to improve the following deficits and impairments:  Abnormal gait, Decreased mobility, Decreased endurance, Decreased balance, Difficulty walking, Hypomobility, Impaired perceived functional ability, Decreased range of motion, Improper body mechanics, Pain, Postural dysfunction, Impaired flexibility, Decreased strength, Decreased activity tolerance  Visit Diagnosis: Right knee pain, unspecified chronicity  Left knee pain, unspecified chronicity  Difficulty in walking, not elsewhere classified     Problem List Patient Active Problem List   Diagnosis Date Noted  . Osteoarthritis of both knees 06/17/2018    Nancy Nordmann, PT, DPT 09/04/2018, 2:25 PM  [facility] [address] [place], [address] Phone: [phone]   Fax:  [phone]  Name: MAKIA BOSSI MRN: [id number] Date of Birth: 01-Feb-1951

## 2018-09-12 ENCOUNTER — Encounter: Payer: Self-pay | Admitting: Physical Therapy

## 2018-09-12 ENCOUNTER — Ambulatory Visit: Payer: Medicare Other | Admitting: Physical Therapy

## 2018-09-12 DIAGNOSIS — M25561 Pain in right knee: Secondary | ICD-10-CM | POA: Diagnosis not present

## 2018-09-12 DIAGNOSIS — R262 Difficulty in walking, not elsewhere classified: Secondary | ICD-10-CM

## 2018-09-12 DIAGNOSIS — M25562 Pain in left knee: Secondary | ICD-10-CM | POA: Diagnosis not present

## 2018-09-12 NOTE — Therapy (Signed)
[facility] [address] [place], [address] Phone: [phone]   Fax:  [phone]  Physical Therapy Treatment  Patient Details  Name: Joyce Estes MRN: [id number] Date of Birth: 09-08-1950 Referring Provider (PT): Venita Lick, NP   Encounter Date: 09/12/2018  PT End of Session - 09/12/18 1435    Visit Number  6    Number of Visits  9    Date for PT Re-Evaluation  10/02/18    Authorization Type  [employer] Medicare    Authorization Time Period  Current cert period: 11/03/4534 - 10/02/2018 (last PN: [facility] 09/04/2018)    Authorization - Visit Number  2    Authorization - Number of Visits  9    PT Start Time  1430    PT Stop Time  1510    PT Time Calculation (min)  40 min    Activity Tolerance  Patient tolerated treatment well;Patient limited by fatigue    Behavior During Therapy  [facility] for tasks assessed/performed       History reviewed. No pertinent past medical history.  Past Surgical History:  Procedure Laterality Date  . FOOT SURGERY      There were no vitals filed for this visit.  Subjective Assessment - 09/12/18 1433    Subjective  Patient reports she is feeling well today with no pain. The last time she had pain was a little bit (3/10) at the end of her work shift.  She felt good after her last teratemnt session. She has no special request for what to work on today. She states her HEP has been going well and she has been doing them every other day. She reports no questions about her HEP.     Pertinent History  Patient is a 68 y.o. female who presents to outpatient physical therapy with a referral for chronic pain of both knees; osteoarthritis to both knees with chronic pain, ongoing. This patient's chief complaints consist of bilateral knee pain R > L that started about 04/28/2018, stiffness, and reduced activity tolerance, leading to the following functional deficits: difficulty with work activities  including walking, standing; discontinued exercise routine; difficulty with weight bearing activities. Relevant past medical history and comorbidities include left foot surgery in 1984 (has remaninig stiffness in left ankle that has changed her gait pattern since then).  Imaging: Radiograph reports dated 07/02/2018: R knee = "IMPRESSION: 1. Moderate patellofemoral joint degenerative changes. 2. Mild medial and lateral tibiofemoral joint space narrowing. 3. Bones may be osteopenic." L knee = "IMPRESSION: 1. Moderate patellofemoral joint degenerative changes. 2. Mild medial tibiofemoral joint space narrowing. 3. Minimal lateral tibiofemoral joint degenerative changes. 4. Bones may be osteopenic."   work, fitness routine   Limitations  Standing;Walking;House hold activities;Other (comment)    How long can you sit comfortably?  unlimited    How long can you stand comfortably?  4 hours    How long can you walk comfortably?  30 min    Diagnostic tests  Imaging: Radiograph reports dated 07/02/2018: R knee = "IMPRESSION: 1. Moderate patellofemoral joint degenerative changes. 2. Mild medial and lateral tibiofemoral joint space narrowing. 3. Bones may be osteopenic." L knee = "IMPRESSION: 1. Moderate patellofemoral joint degenerative changes. 2. Mild medial tibiofemoral joint space narrowing. 3. Minimal lateral tibiofemoral joint degenerative changes. 4. Bones may be osteopenic."    Patient Stated Goals  "to be able to stretch and use her legs and knees better"    Currently  in Pain?  No/denies    Pain Onset  More than a month ago       TREATMENT: Therapeutic exercise:to centralize symptoms and improve ROM and strength required for successful completion of functional activities. -Recumbent Bikeno added resistance at 60 rpm.Seat position 4.For improved lower extremity ROM, muscular endurance, and activity tolerance; and to induce the analgesic effect of aerobic exercise, stimulate joint nutrition, and prepare  body structures and systems for following interventions.X 5 min during subjective exam. - very short arc quad foam roll under knees and 10# ankle weight applied, 5 second hold. X 2 min plus time for appropriate rest and transition right side only.    - short arc quad round bolster under knees and 15# ankle weight applied, 5 second hold. X 2 min plus time for appropriate rest and transition each side.  - Long arc quad, 15# ankle weight applied, 5 second hold. X 2 min plus time for appropriate rest and transition each side.  -Sit <> stand with band around knees and cuing for knee alignment. 2x10.  - step ups with unilateral UE support to 8 inch step x 10 each side.  - standing hip abduction against yellow theraband looped around ankle 2x10 each side to improve hip strength for single leg stance activities. Cuing for form to achieve correct muscle activation. BUE suppoert   Manual therapy:to reduce pain and tissue tension, improve range of motion, neuromodulation, in order to promote improved ability to complete functional activities.Improved ROM following. No lasting discomfort. - STM with foam roll assist over right quad muscles - Tibiofemoral joint mobilizations grades II-IV, supine AP with foam roll behind femur, hooklying PA and tibial ER, flexion over therapist forearm, ~ 2x 10 oscillations each position to improve ROM and decrease pain.  - R knee extension overpressuregrade III-IV, 2x10 to tolerance   HOME EXERCISE PROGRAM Access Code: [email or web address]  URL: [email or web address]  Date: 08/07/2018  Prepared by: Rosita Kea   Exercises   Supine Bridge with Resistance Band - 10-15 reps - 1 second hold - 3 Sets - 1x daily - 3x weekly   Clamshell with Resistance - 10-15 reps - 1 second hold - 3 Sets - 1x daily - 3x weekly   Seated Knee Extension with Resistance - 10-15 reps - 1 second hold - 3 Sets - 1x daily - 3x weekly   Sit to Stand without Arm Support - 5 - 10 reps - 1  second hold - 3 Sets - 1x daily - 3x weekly   Step Up - 10-15 reps - 1 second hold - 3 Sets - 1x daily - 3x weekly   Patient response to treatment:  Pt tolerated treatment well. Pt was able to complete all exercisesincluding progressionsto more repswithno complaints of pain or discomfort.Patient continues toimprove inknee extensionand flexionwith less pain. She was able to continue with increased weight bearing exercises. She did demonstrate improved cardiorespiratory endurance during warmup and was able to complete it without breaks. Ptrequired cuing for proper technique and to facilitate improved neuromuscular control, strength, range of motion, and functional ability.Pt required assistance with counting.Patient is making progress towards goals at this point   PT Education - 09/12/18 1435    Education Details  cuing for form/purpose of exercise. help with counitng. self-management advice.     Person(s) Educated  Patient    Methods  Explanation;Demonstration;Tactile cues;Verbal cues    Comprehension  Verbalized understanding;Returned demonstration        PT Short Term Goals - 07/29/18 1539  PT SHORT TERM GOAL #1   Title  Be independent with home exercise program completed at least 3 times per week for self-management of symptoms    Baseline  initial HEP provided at IE (07/17/2018)    Time  2    Period  Weeks    Status  Achieved    Target Date  07/29/18        PT Long Term Goals - 09/04/18 1317      PT LONG TERM GOAL #1   Title  Be independent with a long-term home exercise program for self-management of symptoms.     Baseline  Initial HEP provided at IE (07/17/2018); performing at this point but has not yet received final  long term HEP (09/04/2018)    Time  4    Period  Weeks    Status  Partially Met    Target Date  10/02/18      PT LONG TERM GOAL #2   Title  Demonstrate improved FOTO score by 10 units to demonstrate improvement in overall condition and  self-reported functional ability.     Baseline  46 (07/17/2018); 65 (09/04/2018)    Time  8    Period  Weeks    Status  Achieved    Target Date  09/11/18      PT LONG TERM GOAL #3   Title  Patient will increase BLE gross strength to 4+/5 as to improve functional strength for independent gait, increased standing tolerance.    Baseline  See objective exam (07/18/2018): met except 4/5 bilateral hip abduction and extension (09/04/2018)    Time  4    Period  Weeks    Status  Partially Met    Target Date  10/02/18      PT LONG TERM GOAL #4   Title  Complete community, work and/or recreational activities without limitation due to current condition.     Baseline  Pain with work activities, stopped fitness routine (07/17/2018); has started back to fitness routine, work activities are getting easier but still has pain by end of shift (09/04/2018)    Time  8    Period  Weeks    Status  Partially Met    Target Date  10/02/18      PT LONG TERM GOAL #5   Title  Be able to demonstrate floor to waist lift with proper form without limitation due to current condition in order to improve ability to lift during usual activities.     Baseline  Testing deferred due to pain (07/17/2018); able to perform floor to waist lift with 9.5# box with good form x 2 (09/04/2018);    Time  8    Period  Weeks    Status  Achieved    Target Date  09/11/18            Plan - 09/12/18 1437    Clinical Impression Statement  Patient has attended 6 physical therapy treatment sessions this episode of care and continues to make steady progress towards goals. She continues to participate in her HEP as prescribed. She continues to have pain at the end of her shifts and at end range PROM and is not fully confident in her ability to maintain her HEP independently long term at this point. She would benefit from continued physical therapy interventions to address remaining impairments and functional limitations    Rehab Potential  Good     Clinical Impairments Affecting Rehab Potential  (+) motivation; (-) inability  to come at reccomended frequency, ankle stiffness    PT Frequency  1x / week    PT Duration  4 weeks    PT Treatment/Interventions  ADLs/Self Care Home Management;Biofeedback;Cryotherapy;Moist Heat;Electrical Stimulation;Gait training;Stair training;Functional mobility training;Therapeutic activities;Therapeutic exercise;Balance training;Patient/family education;Manual techniques;Passive range of motion;Dry needling;Spinal Manipulations;Joint Manipulations;Other (comment)   joint mobilizations grades I-V   PT Next Visit Plan  Assess response to HEP and update as appropriate. Advance functional and LE strengthening as tolerated. continue CKC strengthening.     PT Home Exercise Plan  Medbridge Access Code: [email or web address]    Consulted and Agree with Plan of Care  Patient       Patient will benefit from skilled therapeutic intervention in order to improve the following deficits and impairments:  Abnormal gait, Decreased mobility, Decreased endurance, Decreased balance, Difficulty walking, Hypomobility, Impaired perceived functional ability, Decreased range of motion, Improper body mechanics, Pain, Postural dysfunction, Impaired flexibility, Decreased strength, Decreased activity tolerance  Visit Diagnosis: Right knee pain, unspecified chronicity  Left knee pain, unspecified chronicity  Difficulty in walking, not elsewhere classified     Problem List Patient Active Problem List   Diagnosis Date Noted  . Osteoarthritis of both knees 06/17/2018    Nancy Nordmann, PT, DPT 09/13/2018, 4:33 PM  [facility] [address], [place], [address] Phone: [phone]   Fax:  [phone]  Name: Joyce Estes MRN: [id number] Date of Birth: 12/21/1950

## 2018-09-18 ENCOUNTER — Encounter: Payer: Self-pay | Admitting: Physical Therapy

## 2018-09-18 ENCOUNTER — Ambulatory Visit: Payer: Medicare Other | Admitting: Physical Therapy

## 2018-09-18 DIAGNOSIS — M25561 Pain in right knee: Secondary | ICD-10-CM | POA: Diagnosis not present

## 2018-09-18 DIAGNOSIS — M25562 Pain in left knee: Secondary | ICD-10-CM | POA: Diagnosis not present

## 2018-09-18 DIAGNOSIS — R262 Difficulty in walking, not elsewhere classified: Secondary | ICD-10-CM

## 2018-09-18 NOTE — Therapy (Signed)
[facility] [address] [place], [address] Phone: [phone]   Fax:  [phone]  Physical Therapy Treatment and Discharge Summary Reporting period: 07/17/2018 - 09/18/2018  Patient Details  Name: Joyce Estes MRN: [id number] Date of Birth: 1950-10-21 Referring Provider (PT): Venita Lick, NP   Encounter Date: 09/18/2018  PT End of Session - 09/18/18 1322    Visit Number  7    Number of Visits  9    Date for PT Re-Evaluation  10/02/18    Authorization Type  [employer] Medicare    Authorization Time Period  Current cert period: 09/05/2991 - 10/02/2018 (last PN: [facility] 09/04/2018)    Authorization - Visit Number  23    Authorization - Number of Visits  9    PT Start Time  7169    PT Stop Time  1343    PT Time Calculation (min)  40 min    Activity Tolerance  Patient tolerated treatment well;Patient limited by fatigue    Behavior During Therapy  [facility] for tasks assessed/performed       History reviewed. No pertinent past medical history.  Past Surgical History:  Procedure Laterality Date  . FOOT SURGERY      There were no vitals filed for this visit.  Subjective Assessment - 09/18/18 1305    Subjective  Patient reports she is feeling well today. She continues to get mild pain at then end of her work shift but she feels like she is ready for discharge from skilled physical therapy and is confident that she can continue managing her condition independently.     Pertinent History  Patient is a 68 y.o. female who presents to outpatient physical therapy with a referral for chronic pain of both knees; osteoarthritis to both knees with chronic pain, ongoing. This patient's chief complaints consist of bilateral knee pain R > L that started about 04/28/2018, stiffness, and reduced activity tolerance, leading to the following functional deficits: difficulty with work activities including walking, standing; discontinued  exercise routine; difficulty with weight bearing activities. Relevant past medical history and comorbidities include left foot surgery in 1984 (has remaninig stiffness in left ankle that has changed her gait pattern since then).  Imaging: Radiograph reports dated 07/02/2018: R knee = "IMPRESSION: 1. Moderate patellofemoral joint degenerative changes. 2. Mild medial and lateral tibiofemoral joint space narrowing. 3. Bones may be osteopenic." L knee = "IMPRESSION: 1. Moderate patellofemoral joint degenerative changes. 2. Mild medial tibiofemoral joint space narrowing. 3. Minimal lateral tibiofemoral joint degenerative changes. 4. Bones may be osteopenic."   work, fitness routine   Limitations  Standing;Walking;House hold activities;Other (comment)    How long can you sit comfortably?  unlimited    How long can you stand comfortably?  4 hours    How long can you walk comfortably?  4 hours    Diagnostic tests  Imaging: Radiograph reports dated 07/02/2018: R knee = "IMPRESSION: 1. Moderate patellofemoral joint degenerative changes. 2. Mild medial and lateral tibiofemoral joint space narrowing. 3. Bones may be osteopenic." L knee = "IMPRESSION: 1. Moderate patellofemoral joint degenerative changes. 2. Mild medial tibiofemoral joint space narrowing. 3. Minimal lateral tibiofemoral joint degenerative changes. 4. Bones may be osteopenic."    Patient Stated Goals  "to be able to stretch and use her legs and knees better"    Currently in Pain?  No/denies             OBJECTIVE:  FOTO =  89  OBSERVATION/INSPECTION: Patient presents withbilateral genuvalgus, R > L.  PERIPHERAL JOINT MOTION (AROM/PROM in degrees):  *Indicates pain  Knee  Flexion: R =137/142* front ERP, L =145/150 ERP.  Extension: R =-9, L =-2.  STRENGTH:  *Indicates pain Hip  Extension: R =4+/5, L =4+/5.limited ROM  Abduction: R =4+/5, L =4+/5.  Objective measurements completed on examination: See above  findings.     TREATMENT: Therapeutic exercise:to centralize symptoms and improve ROM and strength required for successful completion of functional activities. -Recumbent Bikeno added resistance at 60 rpm.Seat position4.For improved lower extremity ROM, muscular endurance, and activity tolerance; and to induce the analgesic effect of aerobic exercise, stimulate joint nutrition, and prepare body structures and systems for following interventions.X5 min during subjective exam.  -short arc quad round bolster under knees and 15# ankle weight applied, 5 second hold.X 2 min plus time for appropriate rest and transition each side. - Long arc quad,15# ankle weight applied, 5 second hold.X 2 min plus time for appropriate rest and transition each side. -Sit <> stand with band around knees and cuing for knee alignment. 3x10. -step ups with unilateral UE support to 8 inch step x 10 each side.SBA for safety. Cuing for proper sequencing and hip extension. To improve LE strength and activity tolerance for functional activities.  - review of HEP for long term maintenance and self-management.  - measurements to assess readiness for discharge.     HOME EXERCISE PROGRAM Access Code: [email or web address]  URL: [email or web address]  Date: 08/07/2018  Prepared by: Rosita Kea   Exercises   Supine Bridge with Resistance Band - 10-15 reps - 1 second hold - 3 Sets - 1x daily - 3x weekly   Clamshell with Resistance - 10-15 reps - 1 second hold - 3 Sets - 1x daily - 3x weekly   Seated Knee Extension with Resistance - 10-15 reps - 1 second hold - 3 Sets - 1x daily - 3x weekly   Sit to Stand without Arm Support - 5 - 10 reps - 1 second hold - 3 Sets - 1x daily - 3x weekly   Step Up - 10-15 reps - 1 second hold - 3 Sets - 1x daily - 3x weekly   Patient response to treatment:  Pt tolerated treatment well. Pt was able to complete all exercisesincluding progressionsto more repswithno  complaints of pain or discomfort.Patient continues toimprove inknee extensionand flexionwith less pain and appears to be ready for discharge. She was able to continue with increased weight bearing exercises. She did demonstrate improved cardiorespiratory endurance during warmup and was able to complete it without breaks.Ptrequired cuing for proper technique and to facilitate improved neuromuscular control, strength, range of motion, and functional ability.   PT Education - 09/18/18 1305    Education Details   Exercise purpose/form. Self management techniques. Education on diagnosis, prognosis, POC, anatomy and physiology of current condition    Person(s) Educated  Patient    Methods  Explanation;Demonstration;Tactile cues;Verbal cues    Comprehension  Verbalized understanding;Returned demonstration       PT Short Term Goals - 07/29/18 1539      PT SHORT TERM GOAL #1   Title  Be independent with home exercise program completed at least 3 times per week for self-management of symptoms    Baseline  initial HEP provided at IE (07/17/2018)    Time  2    Period  Weeks    Status  Achieved    Target Date  07/29/18  PT Long Term Goals - 09/18/18 1307      PT LONG TERM GOAL #1   Title  Be independent with a long-term home exercise program for self-management of symptoms.     Baseline  Initial HEP provided at IE (07/17/2018); performing at this point but has not yet received final  long term HEP (09/04/2018)    Time  4    Period  Weeks    Status  Achieved    Target Date  10/02/18      PT LONG TERM GOAL #2   Title  Demonstrate improved FOTO score by 10 units to demonstrate improvement in overall condition and self-reported functional ability.     Baseline  46 (07/17/2018); 65 (09/04/2018); 89 (09/18/2018);     Time  8    Period  Weeks    Status  Achieved    Target Date  09/11/18      PT LONG TERM GOAL #3   Title  Patient will increase BLE gross strength to 4+/5 as to improve  functional strength for independent gait, increased standing tolerance.    Baseline  See objective exam (07/18/2018): met except 4/5 bilateral hip abduction and extension (09/04/2018)    Time  4    Period  Weeks    Status  Partially Met      PT LONG TERM GOAL #4   Title  Complete community, work and/or recreational activities without limitation due to current condition.     Baseline  Pain with work activities, stopped fitness routine (07/17/2018); has started back to fitness routine, work activities are getting easier but still has pain by end of shift (09/04/2018); states she is no longer limited (09/18/2018)    Time  8    Period  Weeks    Status  Achieved    Target Date  10/02/18      PT LONG TERM GOAL #5   Title  Be able to demonstrate floor to waist lift with proper form without limitation due to current condition in order to improve ability to lift during usual activities.     Baseline  Testing deferred due to pain (07/17/2018); able to perform floor to waist lift with 9.5# box with good form x 2 (09/04/2018);    Time  8    Period  Weeks    Status  Achieved    Target Date  09/11/18            Plan - 09/18/18 1327    Clinical Impression Statement  Patient has attended 7 physical therapy treatment sessions this episode of care. She has made steady progress and met all goals. She has demonstrated good ability to keep up with her HEP independnetly. She has improved in ROM, strength, pain level, FOTO score (self-reported function), and activity tolerance both reported and demonstrated in clinic. She continues to have mild pain at the end of her shifts and at end range PROM but now feels confident in her ability to continue with HEP independently long term. She appears ready for discharge from skilled physical therapy at this time due to meeting all goals.She has been provided with a long term HEP for long term self management.    Rehab Potential  Good    Clinical Impairments Affecting Rehab  Potential  (+) motivation; (-) inability to come at reccomended frequency, ankle stiffness    PT Frequency  1x / week    PT Duration  4 weeks    PT Treatment/Interventions  ADLs/Self  Care Home Management;Biofeedback;Cryotherapy;Moist Heat;Electrical Stimulation;Gait training;Stair training;Functional mobility training;Therapeutic activities;Therapeutic exercise;Balance training;Patient/family education;Manual techniques;Passive range of motion;Dry needling;Spinal Manipulations;Joint Manipulations;Other (comment)   joint mobilizations grades I-V   PT Next Visit Plan  Patient is now discharged from skilled physical therapy due to acheiving goals.     PT Home Exercise Plan  Medbridge Access Code: [email or web address]    Consulted and Agree with Plan of Care  Patient       Patient will benefit from skilled therapeutic intervention in order to improve the following deficits and impairments:  Abnormal gait, Decreased mobility, Decreased endurance, Decreased balance, Difficulty walking, Hypomobility, Impaired perceived functional ability, Decreased range of motion, Improper body mechanics, Pain, Postural dysfunction, Impaired flexibility, Decreased strength, Decreased activity tolerance  Visit Diagnosis: Right knee pain, unspecified chronicity  Left knee pain, unspecified chronicity  Difficulty in walking, not elsewhere classified     Problem List Patient Active Problem List   Diagnosis Date Noted  . Osteoarthritis of both knees 06/17/2018    Nancy Nordmann, PT, DPT 09/18/2018, 1:53 PM  [facility] [address], [place], [address] Phone: [phone]   Fax:  [phone]  Name: Joyce Estes MRN: [id number] Date of Birth: [birth year]

## 2018-09-26 ENCOUNTER — Ambulatory Visit: Payer: Medicare Other | Admitting: Physical Therapy

## 2018-10-02 ENCOUNTER — Encounter: Payer: Medicare Other | Admitting: Physical Therapy

## 2018-10-03 ENCOUNTER — Ambulatory Visit (INDEPENDENT_AMBULATORY_CARE_PROVIDER_SITE_OTHER): Payer: Medicare Other | Admitting: Nurse Practitioner

## 2018-10-03 ENCOUNTER — Encounter: Payer: Self-pay | Admitting: Nurse Practitioner

## 2018-10-03 VITALS — BP 101/68 | HR 74 | Temp 97.8°F | Wt 129.8 lb

## 2018-10-03 DIAGNOSIS — Z780 Asymptomatic menopausal state: Secondary | ICD-10-CM

## 2018-10-03 DIAGNOSIS — E559 Vitamin D deficiency, unspecified: Secondary | ICD-10-CM | POA: Diagnosis not present

## 2018-10-03 DIAGNOSIS — Z1231 Encounter for screening mammogram for malignant neoplasm of breast: Secondary | ICD-10-CM

## 2018-10-03 DIAGNOSIS — Z13220 Encounter for screening for lipoid disorders: Secondary | ICD-10-CM | POA: Diagnosis not present

## 2018-10-03 DIAGNOSIS — M170 Bilateral primary osteoarthritis of knee: Secondary | ICD-10-CM

## 2018-10-03 DIAGNOSIS — Z1211 Encounter for screening for malignant neoplasm of colon: Secondary | ICD-10-CM | POA: Diagnosis not present

## 2018-10-03 NOTE — Patient Instructions (Signed)
Bone Density Test The bone density test uses a special type of X-ray to measure the amount of calcium and other minerals in your bones. It can measure bone density in the hip and the spine. The test procedure is similar to having a regular X-ray. This test may also be called:  Bone densitometry.  Bone mineral density test.  Dual-energy X-ray absorptiometry (DEXA). You may have this test to:  Diagnose a condition that causes weak or thin bones (osteoporosis).  Screen you for osteoporosis.  Predict your risk for a broken bone (fracture).  Determine how well your osteoporosis treatment is working. Tell a health care provider about:  Any allergies you have.  All medicines you are taking, including vitamins, herbs, eye drops, creams, and over-the-counter medicines.  Any problems you or family members have had with anesthetic medicines.  Any blood disorders you have.  Any surgeries you have had.  Any medical conditions you have.  Whether you are pregnant or may be pregnant.  Any medical tests you have had within the past 14 days that used contrast material. What are the risks? Generally, this is a safe procedure. However, it does expose you to a small amount of radiation, which can slightly increase your cancer risk. What happens before the procedure?  Do not take any calcium supplements starting 24 hours before your test.  Remove all metal jewelry, eyeglasses, dental appliances, and any other metal objects. What happens during the procedure?   You will lie down on an exam table. There will be an X-ray generator below you and an imaging device above you.  Other devices, such as boxes or braces, may be used to position your body properly for the scan.  The machine will slowly scan your body. You will need to keep still.  The images will show up on a screen in the room. Images will be examined by a specialist after your test is done. The procedure may vary among health care  providers and hospitals. What happens after the procedure?  It is up to you to get your test results. Ask your health care provider, or the department that is doing the test, when your results will be ready. Summary  A bone density test is an imaging test that uses a type of X-ray to measure the amount of calcium and other minerals in your bones.  The test may be used to diagnose or screen you for a condition that causes weak or thin bones (osteoporosis), predict your risk for a broken bone (fracture), or determine how well your osteoporosis treatment is working.  Do not take any calcium supplements starting 24 hours before your test.  Ask your health care provider, or the department that is doing the test, when your results will be ready. This information is not intended to replace advice given to you by your health care provider. Make sure you discuss any questions you have with your health care provider. Document Released: 09/05/2004 Document Revised: 06/18/2017 Document Reviewed: 06/18/2017 Elsevier Interactive Patient Education  2019 [employer] Osteoarthritis  Osteoarthritis is a type of arthritis that affects tissue that covers the ends of bones in joints (cartilage). Cartilage acts as a cushion between the bones and helps them move smoothly. Osteoarthritis results when cartilage in the joints gets worn down. Osteoarthritis is sometimes called "wear and tear" arthritis. Osteoarthritis is the most common form of arthritis. It often occurs in older people. It is a condition that gets worse over time (a progressive condition). Joints  that are most often affected by this condition are in:  Fingers.  Toes.  Hips.  Knees.  Spine, including neck and lower back. What are the causes? This condition is caused by age-related wearing down of cartilage that covers the ends of bones. What increases the risk? The following factors may make you more likely to develop this  condition:  Older age.  Being overweight or obese.  Overuse of joints, such as in athletes.  Past injury of a joint.  Past surgery on a joint.  Family history of osteoarthritis. What are the signs or symptoms? The main symptoms of this condition are pain, swelling, and stiffness in the joint. The joint may lose its shape over time. Small pieces of bone or cartilage may break off and float inside of the joint, which may cause more pain and damage to the joint. Small deposits of bone (osteophytes) may grow on the edges of the joint. Other symptoms may include:  A grating or scraping feeling inside the joint when you move it.  Popping or creaking sounds when you move. Symptoms may affect one or more joints. Osteoarthritis in a major joint, such as your knee or hip, can make it painful to walk or exercise. If you have osteoarthritis in your hands, you might not be able to grip items, twist your hand, or control small movements of your hands and fingers (fine motor skills). How is this diagnosed? This condition may be diagnosed based on:  Your medical history.  A physical exam.  Your symptoms.  X-rays of the affected joint(s).  Blood tests to rule out other types of arthritis. How is this treated? There is no cure for this condition, but treatment can help to control pain and improve joint function. Treatment plans may include:  A prescribed exercise program that allows for rest and joint relief. You may work with a physical therapist.  A weight control plan.  Pain relief techniques, such as: ? Applying heat and cold to the joint. ? Electric pulses delivered to nerve endings under the skin (transcutaneous electrical nerve stimulation, or TENS). ? Massage. ? Certain nutritional supplements.  NSAIDs or prescription medicines to help relieve pain.  Medicine to help relieve pain and inflammation (corticosteroids). This can be given by mouth (orally) or as an  injection.  Assistive devices, such as a brace, wrap, splint, specialized glove, or cane.  Surgery, such as: ? An osteotomy. This is done to reposition the bones and relieve pain or to remove loose pieces of bone and cartilage. ? Joint replacement surgery. You may need this surgery if you have very bad (advanced) osteoarthritis. Follow these instructions at home: Activity  Rest your affected joints as directed by your health care provider.  Do not drive or use heavy machinery while taking prescription pain medicine.  Exercise as directed. Your health care provider or physical therapist may recommend specific types of exercise, such as: ? Strengthening exercises. These are done to strengthen the muscles that support joints that are affected by arthritis. They can be performed with weights or with exercise bands to add resistance. ? Aerobic activities. These are exercises, such as brisk walking or water aerobics, that get your heart pumping. ? Range-of-motion activities. These keep your joints easy to move. ? Balance and agility exercises. Managing pain, stiffness, and swelling      If directed, apply heat to the affected area as often as told by your health care provider. Use the heat source that your health care  provider recommends, such as a moist heat pack or a heating pad. ? If you have a removable assistive device, remove it as told by your health care provider. ? Place a towel between your skin and the heat source. If your health care provider tells you to keep the assistive device on while you apply heat, place a towel between the assistive device and the heat source. ? Leave the heat on for 20-30 minutes. ? Remove the heat if your skin turns bright red. This is especially important if you are unable to feel pain, heat, or cold. You may have a greater risk of getting burned.  If directed, put ice on the affected joint: ? If you have a removable assistive device, remove it as told  by your health care provider. ? Put ice in a plastic bag. ? Place a towel between your skin and the bag. If your health care provider tells you to keep the assistive device on during icing, place a towel between the assistive device and the bag. ? Leave the ice on for 20 minutes, 2-3 times a day. General instructions  Take over-the-counter and prescription medicines only as told by your health care provider.  Maintain a healthy weight. Follow instructions from your health care provider for weight control. These may include dietary restrictions.  Do not use any products that contain nicotine or tobacco, such as cigarettes and e-cigarettes. These can delay bone healing. If you need help quitting, ask your health care provider.  Use assistive devices as directed by your health care provider.  Keep all follow-up visits as told by your health care provider. This is important. Where to find more information  [employer]: www.niams.http://www.myers.net/nih.gov  [employer]: https://walker.com/www.nia.nih.gov  American College of Rheumatology: www.rheumatology.org Contact a health care provider if:  Your skin turns red.  You develop a rash.  You have pain that gets worse.  You have a fever along with joint or muscle aches. Get help right away if:  You lose a lot of weight.  You suddenly lose your appetite.  You have night sweats. Summary  Osteoarthritis is a type of arthritis that affects tissue covering the ends of bones in joints (cartilage).  This condition is caused by age-related wearing down of cartilage that covers the ends of bones.  The main symptom of this condition is pain, swelling, and stiffness in the joint.  There is no cure for this condition, but treatment can help to control pain and improve joint function. This information is not intended to replace advice given to you by your health care provider. Make sure you discuss any  questions you have with your health care provider. Document Released: 08/14/2005 Document Revised: 05/21/2017 Document Reviewed: 04/17/2016 Elsevier Interactive Patient Education  2019 Elsevier Inc. Acute Knee Pain, Adult Many things can cause knee pain. Sometimes, knee pain is sudden (acute) and may be caused by damage, swelling, or irritation of the muscles and tissues that support your knee. The pain often goes away on its own with time and rest. If the pain does not go away, tests may be done to find out what is causing the pain. Follow these instructions at home: Pay attention to any changes in your symptoms. Take these actions to relieve your pain. If you have a knee sleeve or brace:   Wear the sleeve or brace as told by your doctor. Remove it only as told by your doctor.  Loosen  the sleeve or brace if your toes: ? Tingle. ? Become numb. ? Turn cold and blue.  Keep the sleeve or brace clean.  If the sleeve or brace is not waterproof: ? Do not let it get wet. ? Cover it with a watertight covering when you take a bath or shower. Activity  Rest your knee.  Do not do things that cause pain.  Avoid activities where both feet leave the ground at the same time (high-impact activities). Examples are running, jumping rope, and doing jumping jacks.  Work with a physical therapist to make a safe exercise program, as told by your doctor. Managing pain, stiffness, and swelling   If told, put ice on the knee: ? Put ice in a plastic bag. ? Place a towel between your skin and the bag. ? Leave the ice on for 20 minutes, 2-3 times a day.  If told, put pressure (compression) on your injured knee to control swelling, give support, and help with discomfort. Compression may be done with an elastic bandage. General instructions  Take all medicines only as told by your doctor.  Raise (elevate) your knee while you are sitting or lying down. Make sure your knee is higher than your  heart.  Sleep with a pillow under your knee.  Do not use any products that contain nicotine or tobacco. These include cigarettes, e-cigarettes, and chewing tobacco. These products may slow down healing. If you need help quitting, ask your doctor.  If you are overweight, work with your doctor and a food expert (dietitian) to set goals to lose weight. Being overweight can make your knee hurt more.  Keep all follow-up visits as told by your doctor. This is important. Contact a doctor if:  The knee pain does not stop.  The knee pain changes or gets worse.  You have a fever along with knee pain.  Your knee feels warm when you touch it.  Your knee gives out or locks up. Get help right away if:  Your knee swells, and the swelling gets worse.  You cannot move your knee.  You have very bad knee pain. Summary  Many things can cause knee pain. The pain often goes away on its own with time and rest.  Your doctor may do tests to find out the cause of the pain.  Pay attention to any changes in your symptoms. Relieve your pain with rest, medicines, light activity, and use of ice.  Get help right away if you cannot move your knee or your knee pain is very bad. This information is not intended to replace advice given to you by your health care provider. Make sure you discuss any questions you have with your health care provider. Document Released: 11/10/2008 Document Revised: 01/24/2018 Document Reviewed: 01/24/2018 Elsevier Interactive Patient Education  2019 [employer].

## 2018-10-03 NOTE — Assessment & Plan Note (Signed)
Chronic, improved with PT.  She continues to perform PT exercises at home.  Reports improvement in pain.  Discussed options if pain worsens, such as steroid injection, PT, or ortho consult.  Return in 6 months.

## 2018-10-03 NOTE — Progress Notes (Signed)
BP 101/68   Pulse 74   Temp 97.8 F (36.6 C) (Oral)   Wt 129 lb 12.8 oz (58.9 kg)   SpO2 96%   BMI 26.22 kg/m    Subjective:    Patient ID: Joyce Estes, female    DOB: [birth year], 68 y.o.   MRN: [id number]  HPI: Joyce Estes is a 68 y.o. female  Chief Complaint  Patient presents with  . Follow-up    pt states she is just here to follow up from last visit   KNEE PAIN: Has attended all physical therapy sessions, reports she "liked it and thought it was fun".  Was noted to have joint space narrowing bilateral knees on imaging in November.  States pain is better, reports the only time she feels "a little bit" of pain is if she works a long shift and it has been busy then she notices a little pain to right knee.  She works as Quarry manager at [employer], doing heavy patient care.  At this time she reports overall improvement in knee pain.  Takes OTC Tylenol and Aleeve if present and reports this helps.  States she "thinks" she has a history of having one low Vitamin D level.  Will recheck this today.  Has not been for DEXA scan yet, placed new order as she wishes to have this done.     Relevant past medical, surgical, family and social history reviewed and updated as indicated. Interim medical history since our last visit reviewed. Allergies and medications reviewed and updated.  Review of Systems  Constitutional: Negative for activity change, appetite change, diaphoresis, fatigue and fever.  Respiratory: Negative for cough, chest tightness and shortness of breath.   Cardiovascular: Negative for chest pain, palpitations and leg swelling.  Gastrointestinal: Negative for abdominal distention, abdominal pain, constipation, diarrhea, nausea and vomiting.  Endocrine: Negative for cold intolerance, heat intolerance, polydipsia, polyphagia and polyuria.  Neurological: Negative for dizziness, syncope, weakness, light-headedness, numbness and headaches.  Psychiatric/Behavioral: Negative.      Per HPI unless specifically indicated above     Objective:    BP 101/68   Pulse 74   Temp 97.8 F (36.6 C) (Oral)   Wt 129 lb 12.8 oz (58.9 kg)   SpO2 96%   BMI 26.22 kg/m   Wt Readings from Last 3 Encounters:  10/03/18 129 lb 12.8 oz (58.9 kg)  07/03/18 127 lb 12.8 oz (58 kg)  06/17/18 126 lb 8 oz (57.4 kg)    Physical Exam Vitals signs and nursing note reviewed.  Constitutional:      General: She is awake.     Appearance: She is well-developed.  HENT:     Head: Normocephalic.     Right Ear: Hearing normal.     Left Ear: Hearing normal.     Nose: Nose normal.     Mouth/Throat:     Mouth: Mucous membranes are moist.  Eyes:     General: Lids are normal.        Right eye: No discharge.        Left eye: No discharge.     Conjunctiva/sclera: Conjunctivae normal.     Pupils: Pupils are equal, round, and reactive to light.  Neck:     Musculoskeletal: Normal range of motion and neck supple.     Thyroid: No thyromegaly.     Vascular: No carotid bruit or JVD.  Cardiovascular:     Rate and Rhythm: Normal rate and regular rhythm.  Heart sounds: Normal heart sounds. No murmur. No gallop.   Pulmonary:     Effort: Pulmonary effort is normal.     Breath sounds: Normal breath sounds.  Abdominal:     General: Bowel sounds are normal.     Palpations: Abdomen is soft. There is no hepatomegaly or splenomegaly.  Musculoskeletal:     Right knee: She exhibits normal range of motion, no swelling, no ecchymosis, no laceration and no erythema. No tenderness found.     Left knee: She exhibits normal range of motion, no swelling, no ecchymosis, no laceration and no erythema. No tenderness found.     Right lower leg: No edema.     Left lower leg: No edema.     Comments: Overall improvement in bilateral knee ROM and decreased pain with ROM.  No crepitus noted on exam today.  Lymphadenopathy:     Cervical: No cervical adenopathy.  Skin:    General: Skin is warm and dry.    Neurological:     Mental Status: She is alert and oriented to person, place, and time.  Psychiatric:        Attention and Perception: Attention normal.        Mood and Affect: Mood normal.        Behavior: Behavior normal. Behavior is cooperative.        Thought Content: Thought content normal.        Judgment: Judgment normal.     Results for orders placed or performed in visit on 07/03/18  CBC w/Diff  Result Value Ref Range   WBC 5.4 3.4 - 10.8 x10E3/uL   RBC 4.34 3.77 - 5.28 x10E6/uL   Hemoglobin 13.5 11.1 - 15.9 g/dL   Hematocrit 40.2 34.0 - 46.6 %   MCV 93 79 - 97 fL   MCH 31.1 26.6 - 33.0 pg   MCHC 33.6 31.5 - 35.7 g/dL   RDW 11.9 (L) 12.3 - 15.4 %   Platelets 318 150 - 450 x10E3/uL   Neutrophils 55 Not Estab. %   Lymphs 34 Not Estab. %   Monocytes 8 Not Estab. %   Eos 2 Not Estab. %   Basos 1 Not Estab. %   Neutrophils Absolute 3.0 1.4 - 7.0 x10E3/uL   Lymphocytes Absolute 1.9 0.7 - 3.1 x10E3/uL   Monocytes Absolute 0.4 0.1 - 0.9 x10E3/uL   EOS (ABSOLUTE) 0.1 0.0 - 0.4 x10E3/uL   Basophils Absolute 0.0 0.0 - 0.2 x10E3/uL   Immature Granulocytes 0 Not Estab. %   Immature Grans (Abs) 0.0 0.0 - 0.1 x10E3/uL  TSH  Result Value Ref Range   TSH 0.899 0.450 - 4.500 uIU/mL  Comp Met (CMET)  Result Value Ref Range   Glucose 86 65 - 99 mg/dL   BUN 13 8 - 27 mg/dL   Creatinine, Ser 0.68 0.57 - 1.00 mg/dL   GFR calc non Af Amer 90 >59 mL/min/1.73   GFR calc Af Amer 103 >59 mL/min/1.73   BUN/Creatinine Ratio 19 12 - 28   Sodium 141 134 - 144 mmol/L   Potassium 4.2 3.5 - 5.2 mmol/L   Chloride 105 96 - 106 mmol/L   CO2 22 20 - 29 mmol/L   Calcium 9.5 8.7 - 10.3 mg/dL   Total Protein 6.7 6.0 - 8.5 g/dL   Albumin 3.9 3.6 - 4.8 g/dL   Globulin, Total 2.8 1.5 - 4.5 g/dL   Albumin/Globulin Ratio 1.4 1.2 - 2.2   Bilirubin Total 0.7 0.0 - 1.2 mg/dL  Alkaline Phosphatase 103 39 - 117 IU/L   AST 16 0 - 40 IU/L   ALT 12 0 - 32 IU/L  HgB A1c  Result Value Ref Range   Hgb  A1c MFr Bld 5.4 4.8 - 5.6 %   Est. average glucose Bld gHb Est-mCnc 108 mg/dL      Assessment & Plan:   Problem List Items Addressed This Visit      Musculoskeletal and Integument   Osteoarthritis of both knees - Primary    Chronic, improved with PT.  She continues to perform PT exercises at home.  Reports improvement in pain.  Discussed options if pain worsens, such as steroid injection, PT, or ortho consult.  Return in 6 months.       Other Visit Diagnoses    Vitamin D deficiency       History of low level per patient, recheck today.   Relevant Orders   VITAMIN D 25 Hydroxy (Vit-D Deficiency, Fractures)   Breast cancer screening by mammogram       Relevant Orders   MM DIGITAL SCREENING BILATERAL   Colon cancer screening       Relevant Orders   Cologuard   Postmenopausal estrogen deficiency       Relevant Orders   DG Bone Density   Screening cholesterol level       Lipid panel today.   Relevant Orders   Lipid Panel w/o Chol/HDL Ratio       Follow up plan: Return in about 6 months (around 04/03/2019) for Follow-up.

## 2018-10-04 ENCOUNTER — Telehealth: Payer: Self-pay | Admitting: Nurse Practitioner

## 2018-10-04 ENCOUNTER — Encounter: Payer: Self-pay | Admitting: Nurse Practitioner

## 2018-10-04 DIAGNOSIS — E785 Hyperlipidemia, unspecified: Secondary | ICD-10-CM | POA: Insufficient documentation

## 2018-10-04 LAB — LIPID PANEL W/O CHOL/HDL RATIO
Cholesterol, Total: 189 mg/dL (ref 100–199)
HDL: 74 mg/dL (ref >39)
LDL Calculated: 102 mg/dL — ABNORMAL HIGH (ref 0–99)
Triglycerides: 66 mg/dL (ref 0–149)
VLDL Cholesterol Cal: 13 mg/dL (ref 5–40)

## 2018-10-04 LAB — VITAMIN D 25 HYDROXY (VIT D DEFICIENCY, FRACTURES): Vit D, 25-Hydroxy: 20.7 ng/mL — ABNORMAL LOW (ref 30.0–100.0)

## 2018-10-04 NOTE — Telephone Encounter (Signed)
Copied from CRM [id number]. Topic: Quick Communication - See Telephone Encounter >> Oct 04, 2018  2:04 PM Jens Som A wrote: CRM for notification. See Telephone encounter for: 10/04/18.  Patient is calling back for lab results. Please advise. Thank you

## 2018-10-04 NOTE — Telephone Encounter (Signed)
Attempted to call patient to review results- left message to call back

## 2018-11-06 DIAGNOSIS — Z1212 Encounter for screening for malignant neoplasm of rectum: Secondary | ICD-10-CM | POA: Diagnosis not present

## 2018-11-06 DIAGNOSIS — Z1211 Encounter for screening for malignant neoplasm of colon: Secondary | ICD-10-CM | POA: Diagnosis not present

## 2018-11-06 LAB — COLOGUARD: Cologuard: NEGATIVE

## 2018-12-27 DIAGNOSIS — Z1159 Encounter for screening for other viral diseases: Secondary | ICD-10-CM | POA: Diagnosis not present

## 2019-04-04 ENCOUNTER — Ambulatory Visit (INDEPENDENT_AMBULATORY_CARE_PROVIDER_SITE_OTHER): Payer: Medicare Other | Admitting: Nurse Practitioner

## 2019-04-04 ENCOUNTER — Encounter: Payer: Self-pay | Admitting: Nurse Practitioner

## 2019-04-04 ENCOUNTER — Other Ambulatory Visit: Payer: Self-pay

## 2019-04-04 DIAGNOSIS — M170 Bilateral primary osteoarthritis of knee: Secondary | ICD-10-CM

## 2019-04-04 DIAGNOSIS — E559 Vitamin D deficiency, unspecified: Secondary | ICD-10-CM | POA: Insufficient documentation

## 2019-04-04 NOTE — Assessment & Plan Note (Signed)
Chronic, with improved pain since attending PT.  Minimally using Naproxen and APAP.  Recommend continuing to perform stretches daily as instructed by PT and using APAP/Naproxen as needed.  Have recommended scheduling DEXA scan for further assessment of bone health.  Return in 6 months for annual physical.

## 2019-04-04 NOTE — Assessment & Plan Note (Signed)
Ongoing with improvement reported since taking supplement.  Will plan on repeating labs at next visit.  Have recommended scheduling DEXA scan to further assess bone health.  Continue supplement.  Return in 6 months.

## 2019-04-04 NOTE — Progress Notes (Signed)
There were no vitals taken for this visit.   Subjective:    Patient ID: Joyce Estes, female    DOB: 10/08/1950, 68 y.o.   MRN: [id number]  HPI: Joyce Estes is a 68 y.o. female  Chief Complaint  Patient presents with   Follow-up     This visit was completed via telephone due to the restrictions of the COVID-19 pandemic. All issues as above were discussed and addressed but no physical exam was performed. If it was felt that the patient should be evaluated in the office, they were directed there. The patient verbally consented to this visit. Patient was unable to complete an audio/visual visit due to Technical difficulties,Lack of internet. Due to the catastrophic nature of the COVID-19 pandemic, this visit was done through audio contact only.  Location of the patient: home  Location of the provider: home  Those involved with this call:   Provider: Marnee Guarneri, DNP  CMA: Yvonna Alanis, [place] Desk/Registration: Jill Side   Time spent on call: 15 minutes on the phone discussing health concerns. 10 minutes total spent in review of patient's record and preparation of their chart.   I verified patient identity using two factors (patient name and date of birth). Patient consents verbally to being seen via telemedicine visit today.    ARTHRITIS OF KNEES: She works four days a week in a strenuous job in [place] and is often on feet, bending and lifting.  Imaging to bilateral knees performed on 07/02/18 noting "moderate patellofemoral joint degenerative changes, mild medial & lateral tibiofemoral joint space narrowing, and bones may be osteopenic".  Discussed these findings at length with patient, she has not gone for mammogram or bone density testing as of yet and recommended she schedule this to further assess bones.  Also noted to have low Vitamin D level on last labs and discussed need for daily supplement, which she has been taking every day and reports this is  making her "overall" feel "a lot better".  Has been trying Tylenol, ice, and creams at home with benefit. States she has rarely had to use OTC medications since doing PT.  Attended physical therapy with improvement noted by therapist.  She reports if she does her stretches every day then she feels good. Aggravating factors: weight bearing, bending and movement  Alleviating factors: physical therapy, APAP, NSAIDs and rest  Status: better Treatments attempted: rest, ice, APAP, aleve and physical therapy  Relief with NSAIDs?:  moderate Weakness with weight bearing or walking: no Sensation of giving way: no Locking: no Popping: no Bruising: no Swelling: no Redness: no Paresthesias/decreased sensation: no Fevers: no  Relevant past medical, surgical, family and social history reviewed and updated as indicated. Interim medical history since our last visit reviewed. Allergies and medications reviewed and updated.  Review of Systems  Constitutional: Negative for activity change, appetite change, diaphoresis, fatigue and fever.  Respiratory: Negative for cough, chest tightness and shortness of breath.   Cardiovascular: Negative for chest pain, palpitations and leg swelling.  Gastrointestinal: Negative for abdominal distention, abdominal pain, constipation, diarrhea, nausea and vomiting.  Musculoskeletal: Positive for arthralgias.  Psychiatric/Behavioral: Negative.     Per HPI unless specifically indicated above     Objective:    There were no vitals taken for this visit.  Wt Readings from Last 3 Encounters:  10/03/18 129 lb 12.8 oz (58.9 kg)  07/03/18 127 lb 12.8 oz (58 kg)  06/17/18 126 lb 8 oz (57.4 kg)  Physical Exam   Unable to perform due to telephone visit only.  Results for orders placed or performed in visit on 10/03/18  Lipid Panel w/o Chol/HDL Ratio  Result Value Ref Range   Cholesterol, Total 189 100 - 199 mg/dL   Triglycerides 66 0 - 149 mg/dL   HDL 74 >16>39 mg/dL    VLDL Cholesterol Cal 13 5 - 40 mg/dL   LDL Calculated 109102 (H) 0 - 99 mg/dL  VITAMIN D 25 Hydroxy (Vit-D Deficiency, Fractures)  Result Value Ref Range   Vit D, 25-Hydroxy 20.7 (L) 30.0 - 100.0 ng/mL      Assessment & Plan:   Problem List Items Addressed This Visit      Musculoskeletal and Integument   Osteoarthritis of both knees    Chronic, with improved pain since attending PT.  Minimally using Naproxen and APAP.  Recommend continuing to perform stretches daily as instructed by PT and using APAP/Naproxen as needed.  Have recommended scheduling DEXA scan for further assessment of bone health.  Return in 6 months for annual physical.        Other   Vitamin D deficiency    Ongoing with improvement reported since taking supplement.  Will plan on repeating labs at next visit.  Have recommended scheduling DEXA scan to further assess bone health.  Continue supplement.  Return in 6 months.         I discussed the assessment and treatment plan with the patient. The patient was provided an opportunity to ask questions and all were answered. The patient agreed with the plan and demonstrated an understanding of the instructions.   The patient was advised to call back or seek an in-person evaluation if the symptoms worsen or if the condition fails to improve as anticipated.   I provided 15 minutes of time during this encounter.  Follow up plan: Return in about 6 months (around 10/05/2019) for Annual physical.

## 2019-04-04 NOTE — Patient Instructions (Signed)

## 2019-07-27 IMAGING — DX DG KNEE COMPLETE 4+V*L*
4 series · 4 of 4 positions shown · non-contrast
Comparison: None.

CLINICAL DATA: 67-year-old female with chronic knee pain since
[REDACTED] worse on the right. No injury. Initial encounter.

EXAM:
LEFT KNEE - COMPLETE 4+ VIEW

[knee ap]
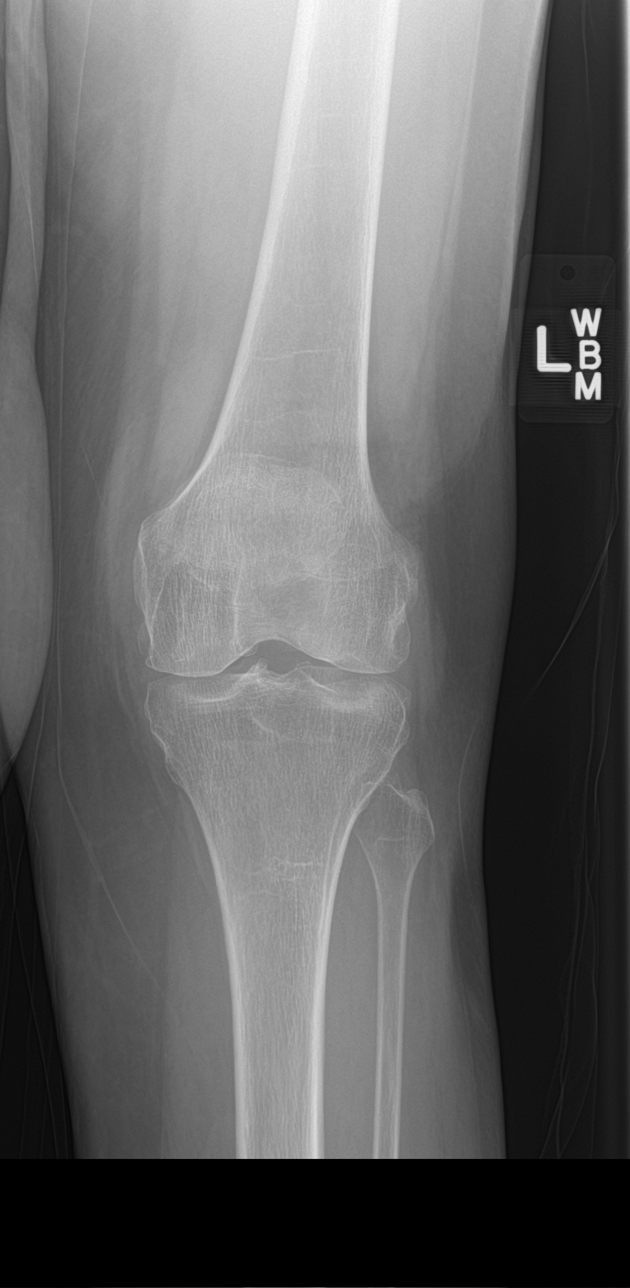

[knee lat]
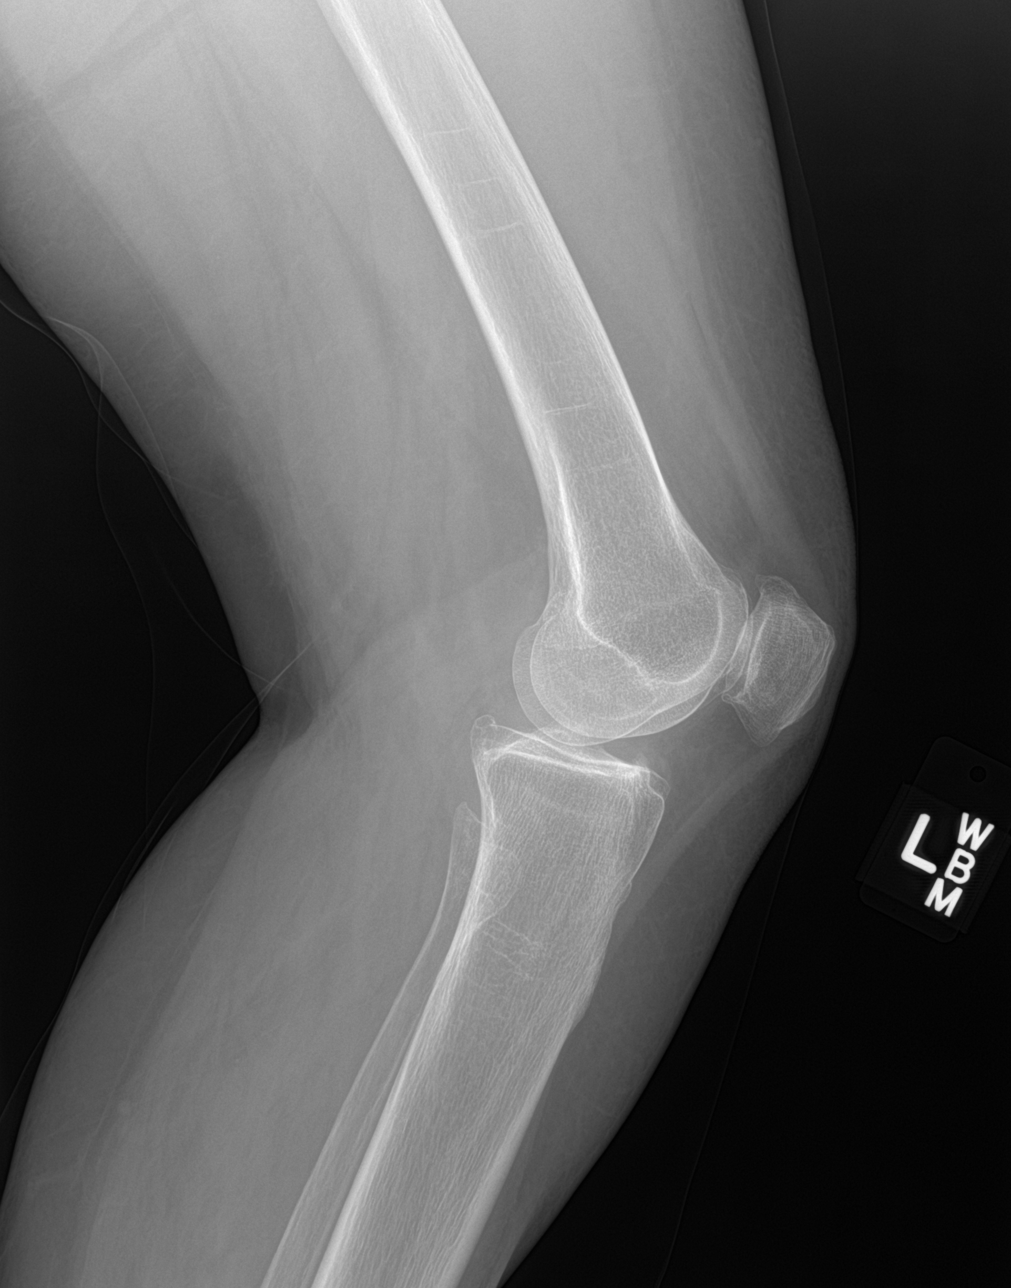

[patella skyline]
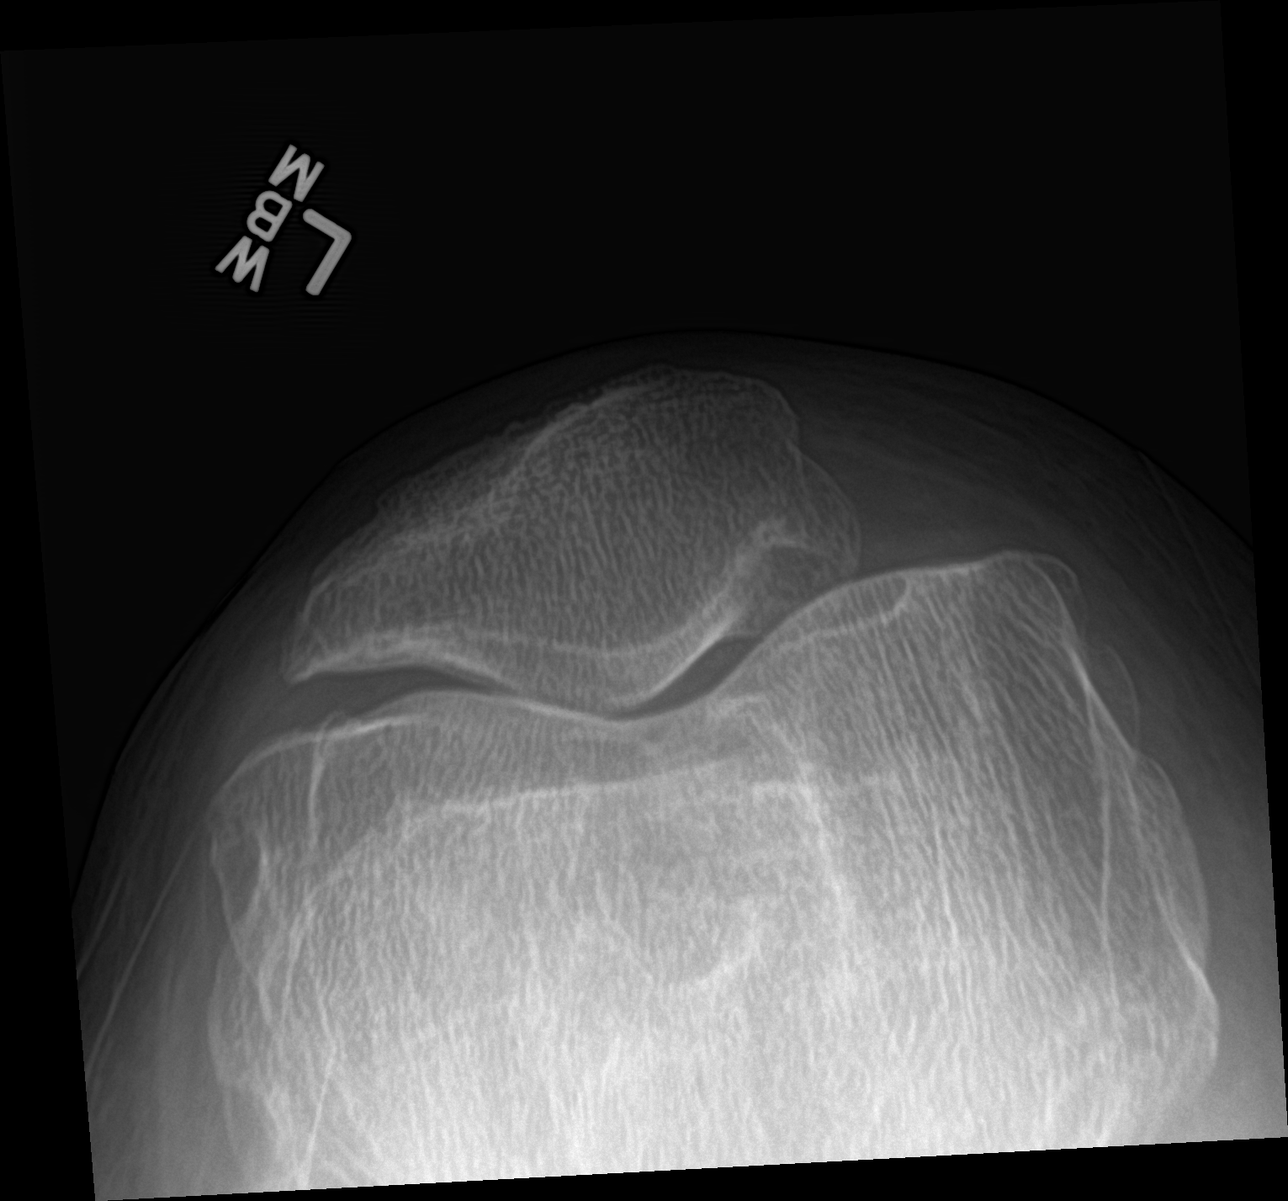

[knee tunnel]
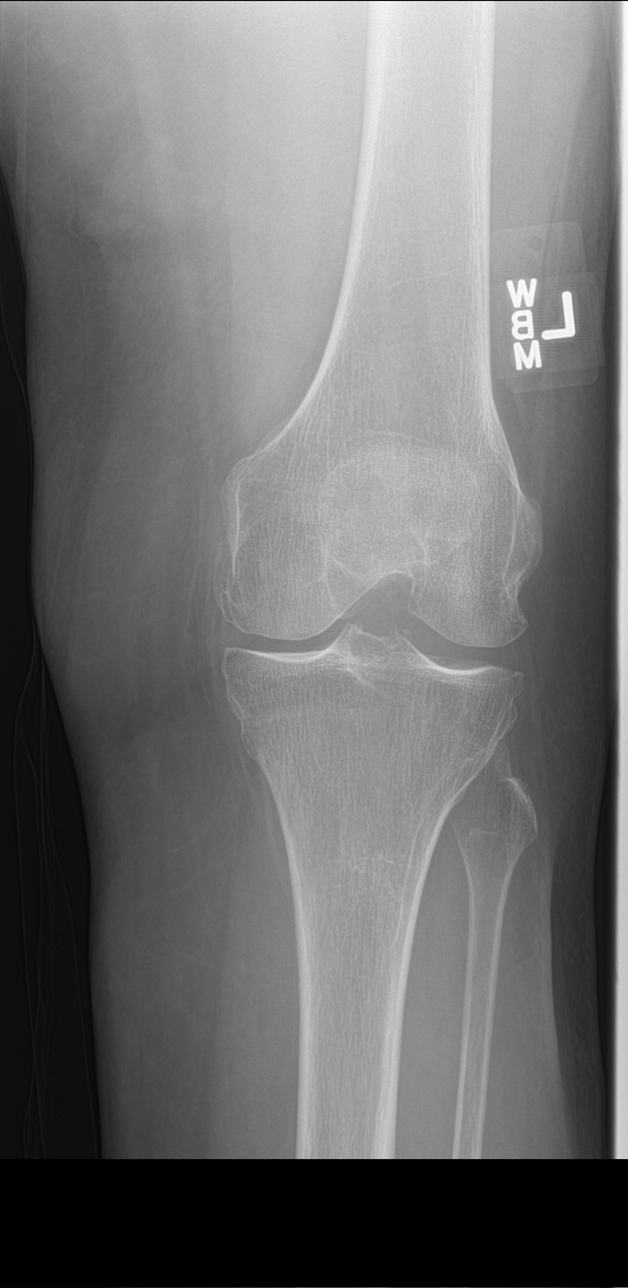

[4 of 4 positions shown; findings below may reference images not displayed]

FINDINGS: Moderate patellofemoral joint degenerative changes. Mild medial
tibiofemoral joint space narrowing. Minimal lateral tibiofemoral
joint degenerative changes. No fracture or dislocation. No evidence
of joint effusion. Bones may be osteopenic.
IMPRESSION: 1. Moderate patellofemoral joint degenerative changes.
2. Mild medial tibiofemoral joint space narrowing.
3. Minimal lateral tibiofemoral joint degenerative changes.
4. Bones may be osteopenic.

## 2019-07-27 IMAGING — DX DG KNEE COMPLETE 4+V*R*
4 series · 4 of 4 positions shown · non-contrast
Comparison: None.

CLINICAL DATA: 67-year-old female with bilateral knee pain worse on
the right. No known injury. Initial encounter.

EXAM:
RIGHT KNEE - COMPLETE 4+ VIEW

[knee tunnel]
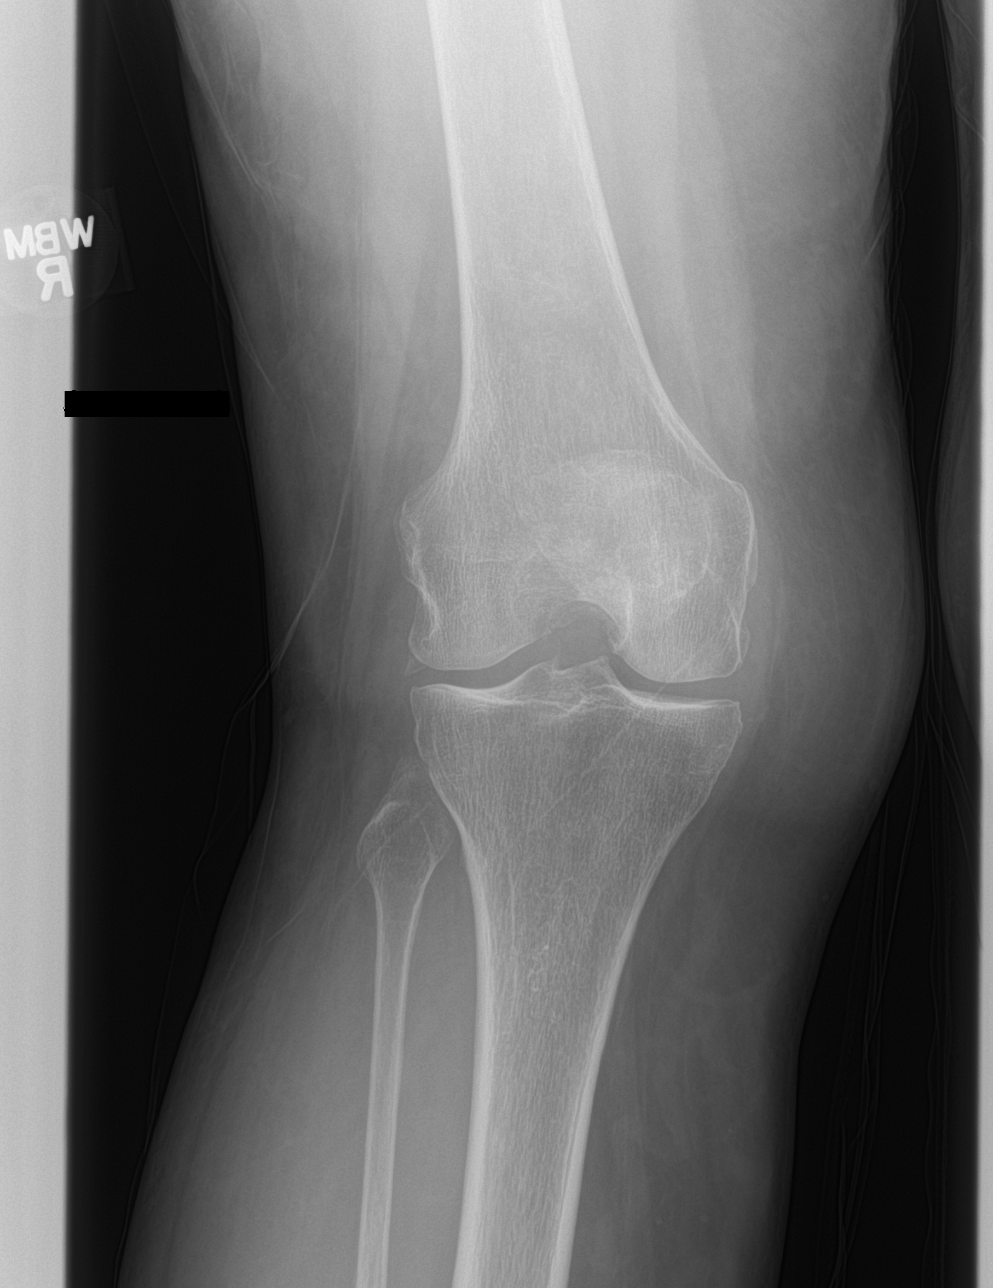

[knee lat]
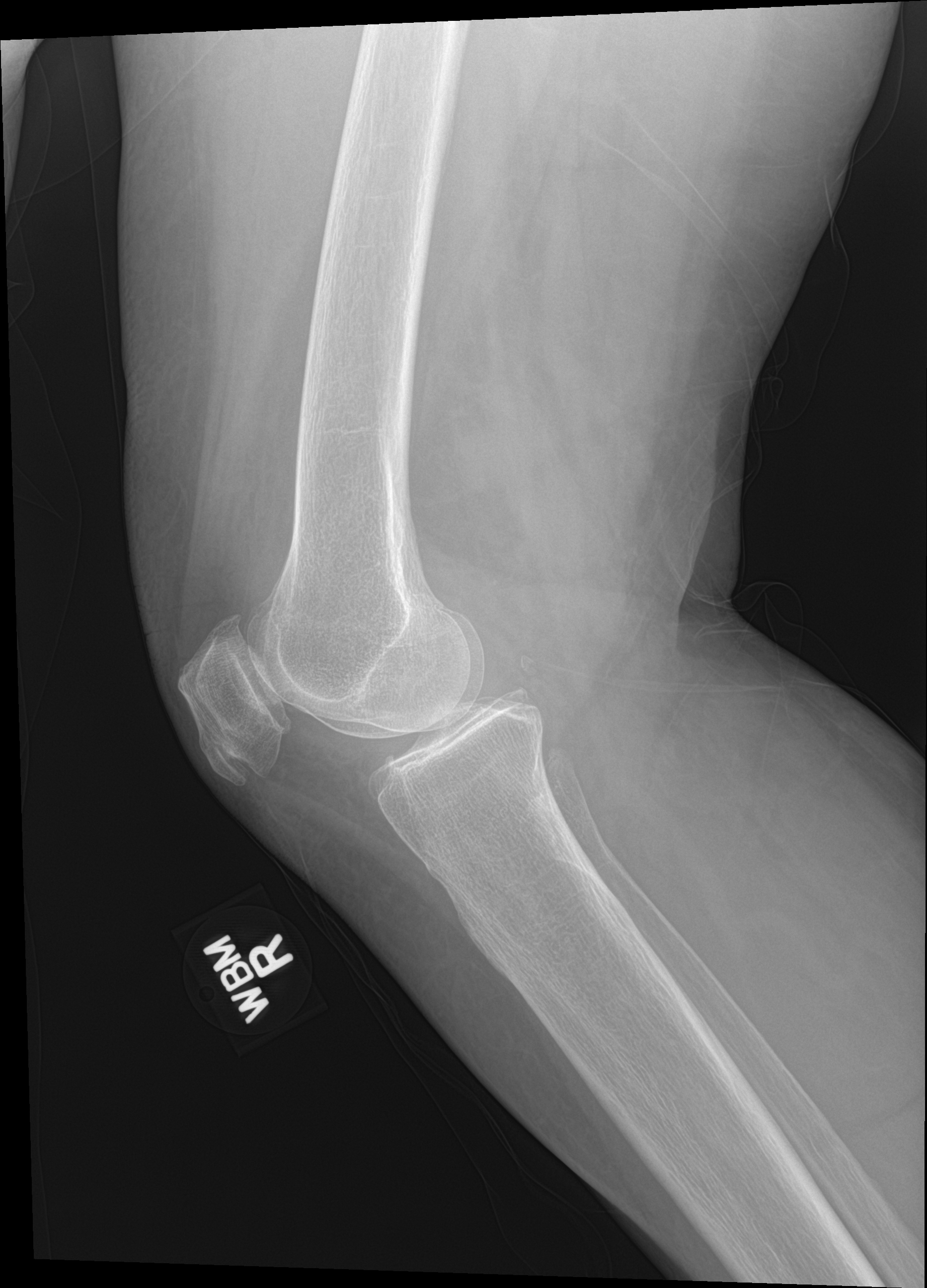

[patella skyline]
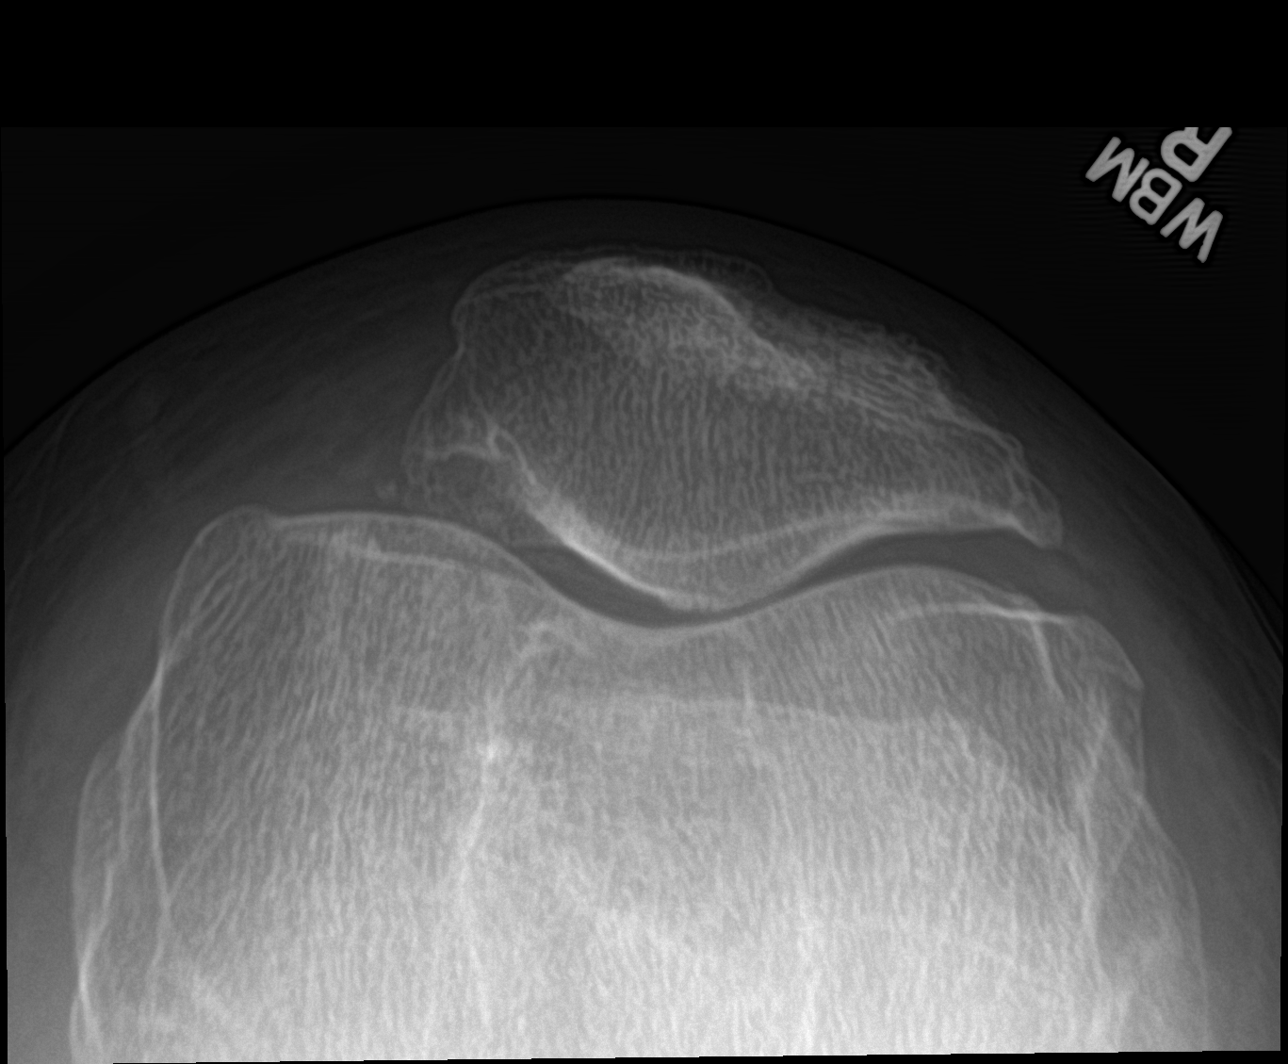

[knee ap]
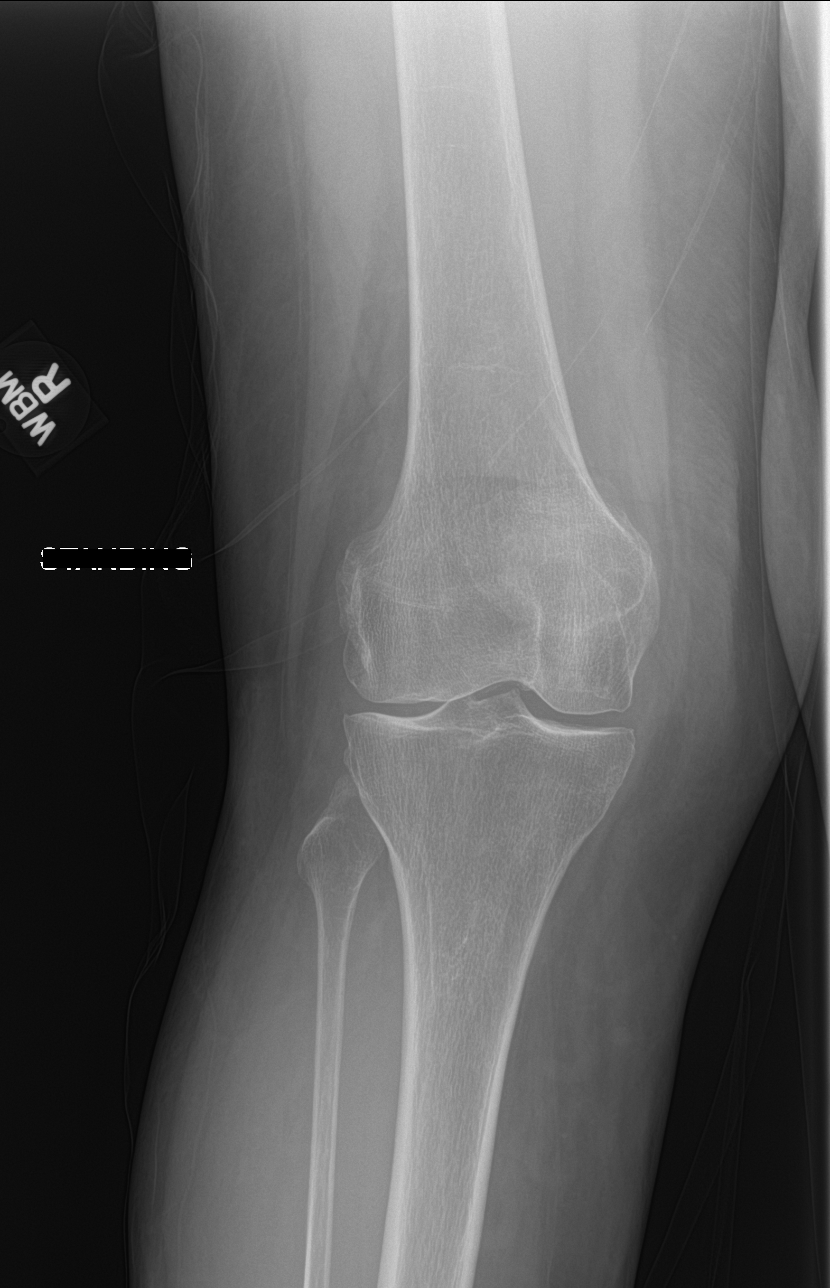

[4 of 4 positions shown; findings below may reference images not displayed]

FINDINGS: Moderate patellofemoral joint degenerative changes. Mild medial and
lateral tibiofemoral joint space narrowing. No significant joint
effusion noted. No fracture or dislocation. Bones may be osteopenic.
IMPRESSION: 1. Moderate patellofemoral joint degenerative changes.
2. Mild medial and lateral tibiofemoral joint space narrowing.
3. Bones may be osteopenic.

## 2019-10-13 ENCOUNTER — Encounter: Payer: Self-pay | Admitting: Nurse Practitioner

## 2019-11-05 ENCOUNTER — Encounter: Payer: Self-pay | Admitting: Nurse Practitioner

## 2019-11-12 ENCOUNTER — Other Ambulatory Visit: Payer: Self-pay

## 2019-11-12 ENCOUNTER — Ambulatory Visit: Payer: Medicare Other

## 2019-11-12 ENCOUNTER — Encounter: Payer: Self-pay | Admitting: Nurse Practitioner

## 2019-11-12 ENCOUNTER — Ambulatory Visit (INDEPENDENT_AMBULATORY_CARE_PROVIDER_SITE_OTHER): Payer: Medicare Other | Admitting: Nurse Practitioner

## 2019-11-12 VITALS — BP 105/69 | HR 81 | Temp 98.3°F | Ht 59.45 in | Wt 130.4 lb

## 2019-11-12 DIAGNOSIS — Z1329 Encounter for screening for other suspected endocrine disorder: Secondary | ICD-10-CM | POA: Diagnosis not present

## 2019-11-12 DIAGNOSIS — M170 Bilateral primary osteoarthritis of knee: Secondary | ICD-10-CM

## 2019-11-12 DIAGNOSIS — Z1211 Encounter for screening for malignant neoplasm of colon: Secondary | ICD-10-CM

## 2019-11-12 DIAGNOSIS — E785 Hyperlipidemia, unspecified: Secondary | ICD-10-CM | POA: Diagnosis not present

## 2019-11-12 DIAGNOSIS — Z0000 Encounter for general adult medical examination without abnormal findings: Secondary | ICD-10-CM

## 2019-11-12 DIAGNOSIS — E559 Vitamin D deficiency, unspecified: Secondary | ICD-10-CM | POA: Diagnosis not present

## 2019-11-12 NOTE — Assessment & Plan Note (Addendum)
Noted on past labs. ASCVD 6.1%.  Continue diet focus and recheck lipid panel today.  She is not fasting.

## 2019-11-12 NOTE — Assessment & Plan Note (Signed)
Ongoing, taking supplement.  Will plan on repeating labs today.  Have recommended scheduling DEXA scan to further assess bone health.  Continue supplement.  Return in 6 months.

## 2019-11-12 NOTE — Patient Instructions (Signed)
[facility]  Address: [address], [place], [place] [address]  Phone: [phone]  Healthy Eating Following a healthy eating pattern may help you to achieve and maintain a healthy body weight, reduce the risk of chronic disease, and live a long and productive life. It is important to follow a healthy eating pattern at an appropriate calorie level for your body. Your nutritional needs should be met primarily through food by choosing a variety of nutrient-rich foods. What are tips for following this plan? Reading food labels  Read labels and choose the following: ? Reduced or low sodium. ? Juices with 100% fruit juice. ? Foods with low saturated fats and high polyunsaturated and monounsaturated fats. ? Foods with whole grains, such as whole wheat, cracked wheat, brown rice, and wild rice. ? Whole grains that are fortified with folic acid. This is recommended for women who are pregnant or who want to become pregnant.  Read labels and avoid the following: ? Foods with a lot of added sugars. These include foods that contain brown sugar, corn sweetener, corn syrup, dextrose, fructose, glucose, high-fructose corn syrup, honey, invert sugar, lactose, malt syrup, maltose, molasses, raw sugar, sucrose, trehalose, or turbinado sugar.  Do not eat more than the following amounts of added sugar per day:  6 teaspoons (25 g) for women.  9 teaspoons (38 g) for men. ? Foods that contain processed or refined starches and grains. ? Refined grain products, such as white flour, degermed cornmeal, white bread, and white rice. Shopping  Choose nutrient-rich snacks, such as vegetables, whole fruits, and nuts. Avoid high-calorie and high-sugar snacks, such as potato chips, fruit snacks, and candy.  Use oil-based dressings and spreads on foods instead of solid fats such as butter, stick margarine, or cream cheese.  Limit pre-made sauces, mixes, and "instant" products  such as flavored rice, instant noodles, and ready-made pasta.  Try more plant-protein sources, such as tofu, tempeh, black beans, edamame, lentils, nuts, and seeds.  Explore eating plans such as the Mediterranean diet or vegetarian diet. Cooking  Use oil to saut or stir-fry foods instead of solid fats such as butter, stick margarine, or lard.  Try baking, boiling, grilling, or broiling instead of frying.  Remove the fatty part of meats before cooking.  Steam vegetables in water or broth. Meal planning   At meals, imagine dividing your plate into fourths: ? One-half of your plate is fruits and vegetables. ? One-fourth of your plate is whole grains. ? One-fourth of your plate is protein, especially lean meats, poultry, eggs, tofu, beans, or nuts.  Include low-fat dairy as part of your daily diet. Lifestyle  Choose healthy options in all settings, including home, work, school, restaurants, or stores.  Prepare your food safely: ? Wash your hands after handling raw meats. ? Keep food preparation surfaces clean by regularly washing with hot, soapy water. ? Keep raw meats separate from ready-to-eat foods, such as fruits and vegetables. ? Cook seafood, meat, poultry, and eggs to the recommended internal temperature. ? Store foods at safe temperatures. In general:  Keep cold foods at 40F (4.4C) or below.  Keep hot foods at 140F (60C) or above.  Keep your freezer at 0F (-17.8C) or below.  Foods are no longer safe to eat when they have been between the temperatures of 40-140F (4.4-60C) for more than 2 hours. What foods should I eat? Fruits Aim to eat 2 cup-equivalents of fresh, canned (in natural juice), or frozen fruits   fruits each day. Examples of 1 cup-equivalent of fruit include 1 small apple, 8 large strawberries, 1 cup canned fruit,  cup dried fruit, or 1 cup 100% juice. Vegetables Aim to eat 2-3 cup-equivalents of fresh and frozen vegetables each day, including different  varieties and colors. Examples of 1 cup-equivalent of vegetables include 2 medium carrots, 2 cups raw, leafy greens, 1 cup chopped vegetable (raw or cooked), or 1 medium baked potato. Grains Aim to eat 6 ounce-equivalents of whole grains each day. Examples of 1 ounce-equivalent of grains include 1 slice of bread, 1 cup ready-to-eat cereal, 3 cups popcorn, or  cup cooked rice, pasta, or cereal. Meats and other proteins Aim to eat 5-6 ounce-equivalents of protein each day. Examples of 1 ounce-equivalent of protein include 1 egg, 1/2 cup nuts or seeds, or 1 tablespoon (16 g) peanut butter. A cut of meat or fish that is the size of a deck of cards is about 3-4 ounce-equivalents.  Of the protein you eat each week, try to have at least 8 ounces come from seafood. This includes salmon, trout, herring, and anchovies. Dairy Aim to eat 3 cup-equivalents of fat-free or low-fat dairy each day. Examples of 1 cup-equivalent of dairy include 1 cup (240 mL) milk, 8 ounces (250 g) yogurt, 1 ounces (44 g) natural cheese, or 1 cup (240 mL) fortified soy milk. Fats and oils  Aim for about 5 teaspoons (21 g) per day. Choose monounsaturated fats, such as canola and olive oils, avocados, peanut butter, and most nuts, or polyunsaturated fats, such as sunflower, corn, and soybean oils, walnuts, pine nuts, sesame seeds, sunflower seeds, and flaxseed. Beverages  Aim for six 8-oz glasses of water per day. Limit coffee to three to five 8-oz cups per day.  Limit caffeinated beverages that have added calories, such as soda and energy drinks.  Limit alcohol intake to no more than 1 drink a day for nonpregnant women and 2 drinks a day for men. One drink equals 12 oz of beer (355 mL), 5 oz of wine (148 mL), or 1 oz of hard liquor (44 mL). Seasoning and other foods  Avoid adding excess amounts of salt to your foods. Try flavoring foods with herbs and spices instead of salt.  Avoid adding sugar to foods.  Try using  oil-based dressings, sauces, and spreads instead of solid fats. This information is based on general [place] nutrition guidelines. For more information, visit [employer]. Exact amounts may vary based on your nutrition needs. Summary  A healthy eating plan may help you to maintain a healthy weight, reduce the risk of chronic diseases, and stay active throughout your life.  Plan your meals. Make sure you eat the right portions of a variety of nutrient-rich foods.  Try baking, boiling, grilling, or broiling instead of frying.  Choose healthy options in all settings, including home, work, school, restaurants, or stores. This information is not intended to replace advice given to you by your health care provider. Make sure you discuss any questions you have with your health care provider. Document Revised: 11/26/2017 Document Reviewed: 11/26/2017 Elsevier Patient Education  [employer].

## 2019-11-12 NOTE — Assessment & Plan Note (Signed)
Chronic, with improved pain at this time.  Minimally using Naproxen and APAP.  Recommend continuing to perform stretches daily as instructed by PT and using APAP/Naproxen as needed.  Have recommended scheduling DEXA scan for further assessment of bone health.  Return in 6 months for follow-up or sooner if worsening pain.

## 2019-11-12 NOTE — Progress Notes (Signed)
BP 105/69 (BP Location: Left Arm, Patient Position: Sitting, Cuff Size: Normal)   Pulse 81   Temp 98.3 F (36.8 C) (Oral)   Ht 4' 11.45" (1.51 m)   Wt 130 lb 6.4 oz (59.1 kg)   SpO2 96%   BMI 25.94 kg/m    Subjective:    Patient ID: Joyce Estes, female    DOB: 1950/11/01, 69 y.o.   MRN: [id number]  HPI: TAKIAH Estes is a 69 y.o. female presenting on 11/12/2019 for comprehensive medical examination. Current medical complaints include:none  She currently lives with: brother Menopausal Symptoms: no   HYPERLIPIDEMIA LDL on last labs 102.  She is not fasting today, had spaghetti and meatballs. Hyperlipidemia status: good compliance Satisfied with current treatment?  yes Side effects:  no Medication compliance: good compliance Past cholesterol meds: none Supplements: none Aspirin:  no The 10-year ASCVD risk score Denman George DC Jr., et al., 2013) is: 6.1%   Values used to calculate the score:     Age: 40 years     Sex: Female     Is Non-Hispanic African American: No     Diabetic: No     Tobacco smoker: No     Systolic Blood Pressure: 105 mmHg     Is BP treated: No     HDL Cholesterol: 74 mg/dL     Total Cholesterol: 189 mg/dL Chest pain:  no Coronary artery disease:  no Family history CAD:  no Family history early CAD:  no   ARTHRALGIAS / JOINT ACHES Has OA to knees, on feet often at work.  Participated in PT in past.  Last Vitamin D level 20.7, continues on daily supplement.  Bone density and mammogram ordered in past, has not attended.  Not currently taking any medication for pain and reports knee pain is improved after PT. Decreased function/range of motion: no Erythema: none Swelling: none Heat or warmth: none Morning stiffness: none Aggravating factors: increased on feet Alleviating factors: stretches every day   Depression Screen done today and results listed below:  Depression screen [facility] 2/9 11/12/2019 06/17/2018 06/17/2018  Decreased Interest 0 0 0   Down, Depressed, Hopeless 0 0 0  PHQ - 2 Score 0 0 0  Altered sleeping 1 1 1   Tired, decreased energy 0 0 0  Change in appetite 0 0 0  Feeling bad or failure about yourself  0 0 0  Trouble concentrating 0 0 0  Moving slowly or fidgety/restless 0 0 0  Suicidal thoughts 0 0 0  PHQ-9 Score 1 1 1   Difficult doing work/chores Not difficult at all - -    The patient does not have a history of falls. I did not complete a risk assessment for falls. A plan of care for falls was not documented.   Past Medical History:  History reviewed. No pertinent past medical history.  Surgical History:  Past Surgical History:  Procedure Laterality Date  . FOOT SURGERY      Medications:  Current Outpatient Medications on File Prior to Visit  Medication Sig  . Acetaminophen (TYLENOL ARTHRITIS PAIN PO) Take by mouth every 6 (six) hours.  . cholecalciferol (VITAMIN D3) 25 MCG (1000 UT) tablet Take 1,000 Units by mouth daily.  . Naproxen Sodium (ALEVE PO) Take by mouth as needed.   No current facility-administered medications on file prior to visit.    Allergies:  No Known Allergies  Social History:  Social History   Socioeconomic History  . Marital status: Single  Spouse name: Not on file  . Number of children: Not on file  . Years of education: Not on file  . Highest education level: Not on file  Occupational History  . Not on file  Tobacco Use  . Smoking status: Never Smoker  . Smokeless tobacco: Never Used  Substance and Sexual Activity  . Alcohol use: Not Currently  . Drug use: Never  . Sexual activity: Not Currently  Other Topics Concern  . Not on file  Social History Narrative  . Not on file   Social Determinants of Health   Financial Resource Strain:   . Difficulty of Paying Living Expenses:   Food Insecurity:   . Worried About Charity fundraiser in the Last Year:   . Arboriculturist in the Last Year:   Transportation Needs:   . Film/video editor (Medical):    Marland Kitchen Lack of Transportation (Non-Medical):   Physical Activity:   . Days of Exercise per Week:   . Minutes of Exercise per Session:   Stress:   . Feeling of Stress :   Social Connections:   . Frequency of Communication with Friends and Family:   . Frequency of Social Gatherings with Friends and Family:   . Attends Religious Services:   . Active Member of Clubs or Organizations:   . Attends Archivist Meetings:   Marland Kitchen Marital Status:   Intimate Partner Violence:   . Fear of Current or Ex-Partner:   . Emotionally Abused:   Marland Kitchen Physically Abused:   . Sexually Abused:    Social History   Tobacco Use  Smoking Status Never Smoker  Smokeless Tobacco Never Used   Social History   Substance and Sexual Activity  Alcohol Use Not Currently    Family History:  Family History  Problem Relation Age of Onset  . Heart disease Mother   . Diabetes Father   . Diabetes Paternal Grandmother     Past medical history, surgical history, medications, allergies, family history and social history reviewed with patient today and changes made to appropriate areas of the chart.   Review of Systems - negative All other ROS negative except what is listed above and in the HPI.      Objective:    BP 105/69 (BP Location: Left Arm, Patient Position: Sitting, Cuff Size: Normal)   Pulse 81   Temp 98.3 F (36.8 C) (Oral)   Ht 4' 11.45" (1.51 m)   Wt 130 lb 6.4 oz (59.1 kg)   SpO2 96%   BMI 25.94 kg/m   Wt Readings from Last 3 Encounters:  11/12/19 130 lb 6.4 oz (59.1 kg)  10/03/18 129 lb 12.8 oz (58.9 kg)  07/03/18 127 lb 12.8 oz (58 kg)    Physical Exam Constitutional:      General: She is awake. She is not in acute distress.    Appearance: She is well-developed. She is not ill-appearing.  HENT:     Head: Normocephalic and atraumatic.     Right Ear: Hearing, tympanic membrane, ear canal and external ear normal. No drainage.     Left Ear: Hearing, tympanic membrane, ear canal and  external ear normal. No drainage.     Nose: Nose normal.     Right Sinus: No maxillary sinus tenderness or frontal sinus tenderness.     Left Sinus: No maxillary sinus tenderness or frontal sinus tenderness.     Mouth/Throat:     Mouth: Mucous membranes are moist.  Pharynx: Oropharynx is clear. Uvula midline. No pharyngeal swelling, oropharyngeal exudate or posterior oropharyngeal erythema.  Eyes:     General: Lids are normal.        Right eye: No discharge.        Left eye: No discharge.     Extraocular Movements: Extraocular movements intact.     Conjunctiva/sclera: Conjunctivae normal.     Pupils: Pupils are equal, round, and reactive to light.     Visual Fields: Right eye visual fields normal and left eye visual fields normal.  Neck:     Thyroid: No thyromegaly.     Vascular: No carotid bruit.     Trachea: Trachea normal.  Cardiovascular:     Rate and Rhythm: Normal rate and regular rhythm.     Heart sounds: Normal heart sounds. No murmur. No gallop.   Pulmonary:     Effort: Pulmonary effort is normal. No accessory muscle usage or respiratory distress.     Breath sounds: Normal breath sounds.  Chest:     Breasts:        Right: Normal.        Left: Normal.  Abdominal:     General: Bowel sounds are normal.     Palpations: Abdomen is soft. There is no hepatomegaly or splenomegaly.     Tenderness: There is no abdominal tenderness.  Musculoskeletal:        General: Normal range of motion.     Cervical back: Normal range of motion and neck supple.     Right lower leg: No edema.     Left lower leg: No edema.  Lymphadenopathy:     Head:     Right side of head: No submental, submandibular, tonsillar, preauricular or posterior auricular adenopathy.     Left side of head: No submental, submandibular, tonsillar, preauricular or posterior auricular adenopathy.     Cervical: No cervical adenopathy.     Upper Body:     Right upper body: No supraclavicular, axillary or pectoral  adenopathy.     Left upper body: No supraclavicular, axillary or pectoral adenopathy.  Skin:    General: Skin is warm and dry.     Capillary Refill: Capillary refill takes less than 2 seconds.     Findings: No rash.  Neurological:     Mental Status: She is alert and oriented to person, place, and time.     Cranial Nerves: Cranial nerves are intact.     Gait: Gait is intact.     Deep Tendon Reflexes: Reflexes are normal and symmetric.     Reflex Scores:      Brachioradialis reflexes are 2+ on the right side and 2+ on the left side.      Patellar reflexes are 2+ on the right side and 2+ on the left side. Psychiatric:        Attention and Perception: Attention normal.        Mood and Affect: Mood normal.        Speech: Speech normal.        Behavior: Behavior normal. Behavior is cooperative.        Thought Content: Thought content normal.        Judgment: Judgment normal.     Results for orders placed or performed in visit on 10/03/18  Lipid Panel w/o Chol/HDL Ratio  Result Value Ref Range   Cholesterol, Total 189 100 - 199 mg/dL   Triglycerides 66 0 - 149 mg/dL   HDL 74 >59 mg/dL  VLDL Cholesterol Cal 13 5 - 40 mg/dL   LDL Calculated 809 (H) 0 - 99 mg/dL  VITAMIN D 25 Hydroxy (Vit-D Deficiency, Fractures)  Result Value Ref Range   Vit D, 25-Hydroxy 20.7 (L) 30.0 - 100.0 ng/mL      Assessment & Plan:   Problem List Items Addressed This Visit      Musculoskeletal and Integument   Osteoarthritis of both knees    Chronic, with improved pain at this time.  Minimally using Naproxen and APAP.  Recommend continuing to perform stretches daily as instructed by PT and using APAP/Naproxen as needed.  Have recommended scheduling DEXA scan for further assessment of bone health.  Return in 6 months for follow-up or sooner if worsening pain.      Relevant Orders   CBC with Differential/Platelet   TSH     Other   Hyperlipidemia LDL goal <100    Noted on past labs. ASCVD 6.1%.   Continue diet focus and recheck lipid panel today.  She is not fasting.      Relevant Orders   Comprehensive metabolic panel   Lipid Panel w/o Chol/HDL Ratio   Vitamin D deficiency    Ongoing, taking supplement.  Will plan on repeating labs today.  Have recommended scheduling DEXA scan to further assess bone health.  Continue supplement.  Return in 6 months.      Relevant Orders   VITAMIN D 25 Hydroxy (Vit-D Deficiency, Fractures)    Other Visit Diagnoses    Annual physical exam    -  Primary   Annual labs to include CBC, CMP, lipid, TSH   Thyroid disorder screen       TSH check on labs   Relevant Orders   TSH   Colon cancer screening       Cologuard ordered   Relevant Orders   Cologuard       Follow up plan: Return in about 6 months (around 05/14/2020) for HLD.   LABORATORY TESTING:  - Pap smear: not applicable  IMMUNIZATIONS:   - Tdap: Tetanus vaccination status reviewed: refused -- recent covid vaccine - Influenza: Refused -recent covid vaccine - Pneumovax: Refused -recent covid vaccine - Prevnar: Refused - recent covid vaccine - HPV: Not applicable - Zostavax vaccine: Refused  SCREENING: -Mammogram: Ordered today  - Colonoscopy: cologuard order  - Bone Density: Ordered today  -Hearing Test: Not applicable  -Spirometry: Not applicable   PATIENT COUNSELING:   Advised to take 1 mg of folate supplement per day if capable of pregnancy.   Sexuality: Discussed sexually transmitted diseases, partner selection, use of condoms, avoidance of unintended pregnancy  and contraceptive alternatives.   Advised to avoid cigarette smoking.  I discussed with the patient that most people either abstain from alcohol or drink within safe limits (<=14/week and <=4 drinks/occasion for males, <=7/weeks and <= 3 drinks/occasion for females) and that the risk for alcohol disorders and other health effects rises proportionally with the number of drinks per week and how often a drinker  exceeds daily limits.  Discussed cessation/primary prevention of drug use and availability of treatment for abuse.   Diet: Encouraged to adjust caloric intake to maintain  or achieve ideal body weight, to reduce intake of dietary saturated fat and total fat, to limit sodium intake by avoiding high sodium foods and not adding table salt, and to maintain adequate dietary potassium and calcium preferably from fresh fruits, vegetables, and low-fat dairy products.    stressed the importance of regular  exercise  Injury prevention: Discussed safety belts, safety helmets, smoke detector, smoking near bedding or upholstery.   Dental health: Discussed importance of regular tooth brushing, flossing, and dental visits.    NEXT PREVENTATIVE PHYSICAL DUE IN 1 YEAR. Return in about 6 months (around 05/14/2020) for HLD.

## 2019-11-13 ENCOUNTER — Other Ambulatory Visit: Payer: Self-pay | Admitting: Nurse Practitioner

## 2019-11-13 DIAGNOSIS — R7989 Other specified abnormal findings of blood chemistry: Secondary | ICD-10-CM

## 2019-11-13 LAB — COMPREHENSIVE METABOLIC PANEL
ALT: 16 IU/L (ref 0–32)
AST: 19 IU/L (ref 0–40)
Albumin/Globulin Ratio: 1.5 (ref 1.2–2.2)
Albumin: 4.3 g/dL (ref 3.8–4.8)
Alkaline Phosphatase: 121 IU/L — ABNORMAL HIGH (ref 39–117)
BUN/Creatinine Ratio: 16 (ref 12–28)
BUN: 12 mg/dL (ref 8–27)
Bilirubin Total: 0.5 mg/dL (ref 0.0–1.2)
CO2: 24 mmol/L (ref 20–29)
Calcium: 9.6 mg/dL (ref 8.7–10.3)
Chloride: 102 mmol/L (ref 96–106)
Creatinine, Ser: 0.74 mg/dL (ref 0.57–1.00)
GFR calc Af Amer: 95 mL/min/{1.73_m2} (ref >59)
GFR calc non Af Amer: 82 mL/min/{1.73_m2} (ref >59)
Globulin, Total: 2.9 g/dL (ref 1.5–4.5)
Glucose: 73 mg/dL (ref 65–99)
Potassium: 4.3 mmol/L (ref 3.5–5.2)
Sodium: 137 mmol/L (ref 134–144)
Total Protein: 7.2 g/dL (ref 6.0–8.5)

## 2019-11-13 LAB — CBC WITH DIFFERENTIAL/PLATELET
Basophils Absolute: 0 10*3/uL (ref 0.0–0.2)
Basos: 1 %
EOS (ABSOLUTE): 0.1 10*3/uL (ref 0.0–0.4)
Eos: 1 %
Hematocrit: 41.2 % (ref 34.0–46.6)
Hemoglobin: 14.3 g/dL (ref 11.1–15.9)
Immature Grans (Abs): 0 10*3/uL (ref 0.0–0.1)
Immature Granulocytes: 0 %
Lymphocytes Absolute: 1.8 10*3/uL (ref 0.7–3.1)
Lymphs: 33 %
MCH: 32.2 pg (ref 26.6–33.0)
MCHC: 34.7 g/dL (ref 31.5–35.7)
MCV: 93 fL (ref 79–97)
Monocytes Absolute: 0.7 10*3/uL (ref 0.1–0.9)
Monocytes: 12 %
Neutrophils Absolute: 2.9 10*3/uL (ref 1.4–7.0)
Neutrophils: 53 %
Platelets: 327 10*3/uL (ref 150–450)
RBC: 4.44 x10E6/uL (ref 3.77–5.28)
RDW: 11.7 % (ref 11.7–15.4)
WBC: 5.5 10*3/uL (ref 3.4–10.8)

## 2019-11-13 LAB — TSH: TSH: 0.381 u[IU]/mL — ABNORMAL LOW (ref 0.450–4.500)

## 2019-11-13 LAB — LIPID PANEL W/O CHOL/HDL RATIO
Cholesterol, Total: 206 mg/dL — ABNORMAL HIGH (ref 100–199)
HDL: 69 mg/dL (ref >39)
LDL Chol Calc (NIH): 122 mg/dL — ABNORMAL HIGH (ref 0–99)
Triglycerides: 86 mg/dL (ref 0–149)
VLDL Cholesterol Cal: 15 mg/dL (ref 5–40)

## 2019-11-13 LAB — VITAMIN D 25 HYDROXY (VIT D DEFICIENCY, FRACTURES): Vit D, 25-Hydroxy: 32.2 ng/mL (ref 30.0–100.0)

## 2019-11-13 NOTE — Progress Notes (Signed)
Good afternoon, please let Joyce Estes know the following: Labs have returned.  Overall CBC, kidney, liver, and electrolytes look good.   - Vitamin D level is improving. Continue supplement for this daily. - Cholesterol levels are elevated, however stroke and heart event risk score is on lower side.  Recommend continued focus on low cholesterol diet and will recheck next visit. - thyroid level is on lower side, more hyperthyroid at this time.  I would like you to come in for lab visit only in 4 weeks to recheck this.  Can you schedule this please?  If it continues to be on low side at that check I may send you to endocrinology for further evaluation.  If any questions let me know.  Have a great day!!

## 2019-12-03 ENCOUNTER — Ambulatory Visit (INDEPENDENT_AMBULATORY_CARE_PROVIDER_SITE_OTHER): Payer: Medicare Other

## 2019-12-03 ENCOUNTER — Other Ambulatory Visit: Payer: Self-pay

## 2019-12-03 ENCOUNTER — Other Ambulatory Visit: Payer: Medicare Other

## 2019-12-03 VITALS — BP 110/73 | HR 69 | Temp 98.0°F | Wt 129.0 lb

## 2019-12-03 DIAGNOSIS — Z1159 Encounter for screening for other viral diseases: Secondary | ICD-10-CM | POA: Diagnosis not present

## 2019-12-03 DIAGNOSIS — Z0000 Encounter for general adult medical examination without abnormal findings: Secondary | ICD-10-CM | POA: Diagnosis not present

## 2019-12-03 DIAGNOSIS — Z23 Encounter for immunization: Secondary | ICD-10-CM

## 2019-12-03 DIAGNOSIS — Z1231 Encounter for screening mammogram for malignant neoplasm of breast: Secondary | ICD-10-CM | POA: Diagnosis not present

## 2019-12-03 DIAGNOSIS — R7989 Other specified abnormal findings of blood chemistry: Secondary | ICD-10-CM | POA: Diagnosis not present

## 2019-12-03 DIAGNOSIS — Z780 Asymptomatic menopausal state: Secondary | ICD-10-CM

## 2019-12-03 DIAGNOSIS — R5383 Other fatigue: Secondary | ICD-10-CM | POA: Diagnosis not present

## 2019-12-03 NOTE — Patient Instructions (Signed)
Joyce Estes , Thank you for taking time to come for your Medicare Wellness Visit. I appreciate your ongoing commitment to your health goals. Please review the following plan we discussed and let me know if I can assist you in the future.   Screening recommendations/referrals: Colonoscopy: cologuard ordered  Mammogram: Please call [phone] to schedule your mammogram.   Bone Density: Please call [phone] to schedule with mammogram.  Recommended yearly ophthalmology/optometry visit for glaucoma screening and checkup Recommended yearly dental visit for hygiene and checkup  Vaccinations: Influenza vaccine: up to date  Pneumococcal vaccine: pneumo 23 due  Tdap vaccine: due now  Shingles vaccine: shingrix eligible    Covid-19: completed   Advanced directives: Advance directive discussed with you today. I have provided a copy for you to complete at home and have notarized. Once this is complete please bring a copy in to our office so we can scan it into your chart.  Conditions/risks identified: none   Next appointment: Follow up in one year for your annual wellness visit    Preventive Care 65 Years and Older, Female Preventive care refers to lifestyle choices and visits with your health care provider that can promote health and wellness. What does preventive care include?  A yearly physical exam. This is also called an annual well check.  Dental exams once or twice a year.  Routine eye exams. Ask your health care provider how often you should have your eyes checked.  Personal lifestyle choices, including:  Daily care of your teeth and gums.  Regular physical activity.  Eating a healthy diet.  Avoiding tobacco and drug use.  Limiting alcohol use.  Practicing safe sex.  Taking low-dose aspirin every day.  Taking vitamin and mineral supplements as recommended by your health care provider. What happens during an annual well check? The services and screenings done by  your health care provider during your annual well check will depend on your age, overall health, lifestyle risk factors, and family history of disease. Counseling  Your health care provider may ask you questions about your:  Alcohol use.  Tobacco use.  Drug use.  Emotional well-being.  Home and relationship well-being.  Sexual activity.  Eating habits.  History of falls.  Memory and ability to understand (cognition).  Work and work Astronomer.  Reproductive health. Screening  You may have the following tests or measurements:  Height, weight, and BMI.  Blood pressure.  Lipid and cholesterol levels. These may be checked every 5 years, or more frequently if you are over 90 years old.  Skin check.  Lung cancer screening. You may have this screening every year starting at age 93 if you have a 30-pack-year history of smoking and currently smoke or have quit within the past 15 years.  Fecal occult blood test (FOBT) of the stool. You may have this test every year starting at age 54.  Flexible sigmoidoscopy or colonoscopy. You may have a sigmoidoscopy every 5 years or a colonoscopy every 10 years starting at age 56.  Hepatitis C blood test.  Hepatitis B blood test.  Sexually transmitted disease (STD) testing.  Diabetes screening. This is done by checking your blood sugar (glucose) after you have not eaten for a while (fasting). You may have this done every 1-3 years.  Bone density scan. This is done to screen for osteoporosis. You may have this done starting at age 4.  Mammogram. This may be done every 1-2 years. Talk to your health care provider about how often  you should have regular mammograms. Talk with your health care provider about your test results, treatment options, and if necessary, the need for more tests. Vaccines  Your health care provider may recommend certain vaccines, such as:  Influenza vaccine. This is recommended every year.  Tetanus,  diphtheria, and acellular pertussis (Tdap, Td) vaccine. You may need a Td booster every 10 years.  Zoster vaccine. You may need this after age 56.  Pneumococcal 13-valent conjugate (PCV13) vaccine. One dose is recommended after age 26.  Pneumococcal polysaccharide (PPSV23) vaccine. One dose is recommended after age 47. Talk to your health care provider about which screenings and vaccines you need and how often you need them. This information is not intended to replace advice given to you by your health care provider. Make sure you discuss any questions you have with your health care provider. Document Released: 09/10/2015 Document Revised: 05/03/2016 Document Reviewed: 06/15/2015 Elsevier Interactive Patient Education  2017 [place] Prevention in the Home Falls can cause injuries. They can happen to people of all ages. There are many things you can do to make your home safe and to help prevent falls. What can I do on the outside of my home?  Regularly fix the edges of walkways and driveways and fix any cracks.  Remove anything that might make you trip as you walk through a door, such as a raised step or threshold.  Trim any bushes or trees on the path to your home.  Use bright outdoor lighting.  Clear any walking paths of anything that might make someone trip, such as rocks or tools.  Regularly check to see if handrails are loose or broken. Make sure that both sides of any steps have handrails.  Any raised decks and porches should have guardrails on the edges.  Have any leaves, snow, or ice cleared regularly.  Use sand or salt on walking paths during winter.  Clean up any spills in your garage right away. This includes oil or grease spills. What can I do in the bathroom?  Use night lights.  Install grab bars by the toilet and in the tub and shower. Do not use towel bars as grab bars.  Use non-skid mats or decals in the tub or shower.  If you need to sit down in  the shower, use a plastic, non-slip stool.  Keep the floor dry. Clean up any water that spills on the floor as soon as it happens.  Remove soap buildup in the tub or shower regularly.  Attach bath mats securely with double-sided non-slip rug tape.  Do not have throw rugs and other things on the floor that can make you trip. What can I do in the bedroom?  Use night lights.  Make sure that you have a light by your bed that is easy to reach.  Do not use any sheets or blankets that are too big for your bed. They should not hang down onto the floor.  Have a firm chair that has side arms. You can use this for support while you get dressed.  Do not have throw rugs and other things on the floor that can make you trip. What can I do in the kitchen?  Clean up any spills right away.  Avoid walking on wet floors.  Keep items that you use a lot in easy-to-reach places.  If you need to reach something above you, use a strong step stool that has a grab bar.  Keep electrical cords  out of the way.  Do not use floor polish or wax that makes floors slippery. If you must use wax, use non-skid floor wax.  Do not have throw rugs and other things on the floor that can make you trip. What can I do with my stairs?  Do not leave any items on the stairs.  Make sure that there are handrails on both sides of the stairs and use them. Fix handrails that are broken or loose. Make sure that handrails are as long as the stairways.  Check any carpeting to make sure that it is firmly attached to the stairs. Fix any carpet that is loose or worn.  Avoid having throw rugs at the top or bottom of the stairs. If you do have throw rugs, attach them to the floor with carpet tape.  Make sure that you have a light switch at the top of the stairs and the bottom of the stairs. If you do not have them, ask someone to add them for you. What else can I do to help prevent falls?  Wear shoes that:  Do not have high  heels.  Have rubber bottoms.  Are comfortable and fit you well.  Are closed at the toe. Do not wear sandals.  If you use a stepladder:  Make sure that it is fully opened. Do not climb a closed stepladder.  Make sure that both sides of the stepladder are locked into place.  Ask someone to hold it for you, if possible.  Clearly mark and make sure that you can see:  Any grab bars or handrails.  First and last steps.  Where the edge of each step is.  Use tools that help you move around (mobility aids) if they are needed. These include:  Canes.  Walkers.  Scooters.  Crutches.  Turn on the lights when you go into a dark area. Replace any light bulbs as soon as they burn out.  Set up your furniture so you have a clear path. Avoid moving your furniture around.  If any of your floors are uneven, fix them.  If there are any pets around you, be aware of where they are.  Review your medicines with your doctor. Some medicines can make you feel dizzy. This can increase your chance of falling. Ask your doctor what other things that you can do to help prevent falls. This information is not intended to replace advice given to you by your health care provider. Make sure you discuss any questions you have with your health care provider. Document Released: 06/10/2009 Document Revised: 01/20/2016 Document Reviewed: 09/18/2014 Elsevier Interactive Patient Education  2017 [employer].

## 2019-12-03 NOTE — Progress Notes (Signed)
Subjective:   Joyce Estes is a 69 y.o. female who presents for an Initial Medicare Annual Wellness Visit.  Review of Systems      Cardiac Risk Factors include: advanced age (>41men, >48 women)     Objective:    Today's Vitals   12/03/19 1429  BP: 110/73  Pulse: 69  Temp: 98 F (36.7 C)  TempSrc: Temporal  Weight: 129 lb (58.5 kg)   Body mass index is 25.66 kg/m.  Advanced Directives 12/03/2019 07/17/2018 04/20/2017  Does Patient Have a Medical Advance Directive? No No No  Would patient like information on creating a medical advance directive? - Yes (MAU/Ambulatory/Procedural Areas - Information given) No - Patient declined    Current Medications (verified) Outpatient Encounter Medications as of 12/03/2019  Medication Sig  . Acetaminophen (TYLENOL ARTHRITIS PAIN PO) Take by mouth every 6 (six) hours.  . cholecalciferol (VITAMIN D3) 25 MCG (1000 UT) tablet Take 1,000 Units by mouth daily.  . Naproxen Sodium (ALEVE PO) Take by mouth as needed.   No facility-administered encounter medications on file as of 12/03/2019.    Allergies (verified) Patient has no known allergies.   History: History reviewed. No pertinent past medical history. Past Surgical History:  Procedure Laterality Date  . FOOT SURGERY     Family History  Problem Relation Age of Onset  . Heart disease Mother   . Diabetes Father   . Diabetes Paternal Grandmother    Social History   Socioeconomic History  . Marital status: Single    Spouse name: Not on file  . Number of children: Not on file  . Years of education: Not on file  . Highest education level: Not on file  Occupational History  . Not on file  Tobacco Use  . Smoking status: Never Smoker  . Smokeless tobacco: Never Used  Substance and Sexual Activity  . Alcohol use: Not Currently  . Drug use: Never  . Sexual activity: Not Currently  Other Topics Concern  . Not on file  Social History Narrative  . Not on file   Social  Determinants of Health   Financial Resource Strain: Low Risk   . Difficulty of Paying Living Expenses: Not hard at all  Food Insecurity: No Food Insecurity  . Worried About Programme researcher, broadcasting/film/video in the Last Year: Never true  . Ran Out of Food in the Last Year: Never true  Transportation Needs: No Transportation Needs  . Lack of Transportation (Medical): No  . Lack of Transportation (Non-Medical): No  Physical Activity: Insufficiently Active  . Days of Exercise per Week: 3 days  . Minutes of Exercise per Session: 30 min  Stress:   . Feeling of Stress :   Social Connections: Somewhat Isolated  . Frequency of Communication with Friends and Family: More than three times a week  . Frequency of Social Gatherings with Friends and Family: Once a week  . Attends Religious Services: More than 4 times per year  . Active Member of Clubs or Organizations: No  . Attends Banker Meetings: Never  . Marital Status: Never married    Tobacco Counseling Counseling given: Not Answered   Clinical Intake:                        Activities of Daily Living In your present state of health, do you have any difficulty performing the following activities: 12/03/2019 11/12/2019  Hearing? N N  Vision? N N  Comment eyeglasses, [facility] -  Difficulty concentrating or making decisions? N N  Walking or climbing stairs? N N  Dressing or bathing? N N  Doing errands, shopping? N N  Preparing Food and eating ? N -  Using the Toilet? N -  In the past six months, have you accidently leaked urine? N -  Do you have problems with loss of bowel control? N -  Managing your Medications? N -  Managing your Finances? N -  Housekeeping or managing your Housekeeping? N -  Some recent data might be hidden     Immunizations and Health Maintenance Immunization History  Administered Date(s) Administered  . Influenza, High Dose Seasonal PF 06/17/2018  . Moderna SARS-COVID-2 Vaccination  10/01/2019, 10/29/2019  . Pneumococcal Conjugate-13 06/17/2018  . Pneumococcal Polysaccharide-23 12/03/2019   Health Maintenance Due  Topic Date Due  . Hepatitis C Screening  Never done  . MAMMOGRAM  Never done  . COLONOSCOPY  Never done  . DEXA SCAN  Never done    Patient Care Team: Marjie Skiff, NP as PCP - General (Nurse Practitioner)  Indicate any recent Medical Services you may have received from other than [facility] providers in the past year (date may be approximate).     Assessment:   This is a routine wellness examination for Joyce Estes.  Hearing/Vision screen  Hearing Screening   125Hz  250Hz  500Hz  1000Hz  2000Hz  3000Hz  4000Hz  6000Hz  8000Hz   Right ear:           Left ear:           Vision Screening Comments: [facility]   Dietary issues and exercise activities discussed: Current Exercise Habits: Home exercise routine, Type of exercise: walking;stretching;strength training/weights, Frequency (Times/Week): 3, Intensity: Mild, Exercise limited by: None identified  Goals Addressed   None    Depression Screen PHQ 2/9 Scores 12/03/2019 11/12/2019 06/17/2018 06/17/2018  PHQ - 2 Score 0 0 0 0  PHQ- 9 Score - 1 1 1     Fall Risk Fall Risk  12/03/2019 06/17/2018  Falls in the past year? 0 No  Number falls in past yr: 0 -  Injury with Fall? 0 -   FALL RISK PREVENTION PERTAINING TO THE HOME:  Any stairs in or around the home? No  If so, are there any without handrails? No   Home free of loose throw rugs in walkways, pet beds, electrical cords, etc? Yes  Adequate lighting in your home to reduce risk of falls? Yes   ASSISTIVE DEVICES UTILIZED TO PREVENT FALLS:  Life alert? No  Use of a cane, walker or w/c? No  Grab bars in the bathroom? Yes  Shower chair or bench in shower? Yes  Elevated toilet seat or a handicapped toilet? Yes    DME ORDERS:  DME order needed?  No   TIMED UP AND GO:  Was the test performed? Yes .  Length of time to ambulate 10 feet: 8 sec.    GAIT:  Appearance of gait: Gait steady and fast without the use of an assistive device.  Education: Fall risk prevention has been discussed.  Intervention(s) required? No   DME/home health order needed?  No     Cognitive Function:     6CIT Screen 12/03/2019  What Year? 0 points  What month? 0 points  What time? 0 points  Count back from 20 0 points  Months in reverse 0 points  Repeat phrase 0 points  Total Score 0    Screening Tests Health  Maintenance  Topic Date Due  . Hepatitis C Screening  Never done  . MAMMOGRAM  Never done  . COLONOSCOPY  Never done  . DEXA SCAN  Never done  . TETANUS/TDAP  12/02/2020 (Originally 06/18/1970)  . INFLUENZA VACCINE  03/28/2020  . PNA vac Low Risk Adult  Completed    Qualifies for Shingles Vaccine? Yes  Zostavax completed n/a. Due for Shingrix. Education has been provided regarding the importance of this vaccine. Pt has been advised to call insurance company to determine out of pocket expense. Advised may also receive vaccine at local pharmacy or Health Dept. Verbalized acceptance and understanding.  Tdap: Although this vaccine is not a covered service during a Wellness Exam, does the patient still wish to receive this vaccine today?  No .  Education has been provided regarding the importance of this vaccine. Advised may receive this vaccine at local pharmacy or Health Dept. Aware to provide a copy of the vaccination record if obtained from local pharmacy or Health Dept. Verbalized acceptance and understanding.  Flu Vaccine: up to date   Pneumococcal Vaccine: completed today   Covid-19 Vaccine:  Completed vaccines  Cancer Screenings:  Colorectal Screening: cologuard ordered   Mammogram: ordered  Bone Density: ordered  Lung Cancer Screening: (Low Dose CT Chest recommended if Age 70-80 years, 30 pack-year currently smoking OR have quit w/in 15years.) does not qualify.    Additional Screening:  Hepatitis C Screening: does  qualify, ordered for next lab draw  Vision Screening: Recommended annual ophthalmology exams for early detection of glaucoma and other disorders of the eye. Is the patient up to date with their annual eye exam?  Yes  Who is the provider or what is the name of the office in which the pt attends annual eye exams? Dr.Woodard  Dental Screening: Recommended annual dental exams for proper oral hygiene  Community Resource Referral:  CRR required this visit?  No       Plan:  I have personally reviewed and addressed the Medicare Annual Wellness questionnaire and have noted the following in the patient's chart:  A. Medical and social history B. Use of alcohol, tobacco or illicit drugs  C. Current medications and supplements D. Functional ability and status E.  Nutritional status F.  Physical activity G. Advance directives H. List of other physicians I.  Hospitalizations, surgeries, and ER visits in previous 12 months J.  [place] such as hearing and vision if needed, cognitive and depression L. Referrals and appointments   In addition, I have reviewed and discussed with patient certain preventive protocols, quality metrics, and best practice recommendations. A written personalized care plan for preventive services as well as general preventive health recommendations were provided to patient.   Signed,    Bevelyn Ngo, LPN   0/04/7352  Nurse Health Advisor   Nurse Notes: left side of neck hurting for several months intermittenttly, left ear pain as well states she had her ear examined at last visit but they looked fine. Denies any swelling, redness or other abnormalities. Just states tender when palpating when it does hut. Denies any pain today.

## 2019-12-04 ENCOUNTER — Other Ambulatory Visit: Payer: Self-pay | Admitting: Nurse Practitioner

## 2019-12-04 DIAGNOSIS — R7989 Other specified abnormal findings of blood chemistry: Secondary | ICD-10-CM

## 2019-12-04 LAB — THYROID PANEL WITH TSH
Free Thyroxine Index: 1.9 (ref 1.2–4.9)
T3 Uptake Ratio: 30 % (ref 24–39)
T4, Total: 6.4 ug/dL (ref 4.5–12.0)
TSH: 0.417 u[IU]/mL — ABNORMAL LOW (ref 0.450–4.500)

## 2019-12-04 LAB — HEPATITIS C ANTIBODY: Hep C Virus Ab: <0.1 s/co ratio (ref 0.0–0.9)

## 2019-12-04 NOTE — Progress Notes (Signed)
Good morning, please let Joyce Estes know her thyroid testing is still a little on low side (hyperthyroid), but trending upwards from previous.  Is trending towards normal range.  I would like to recheck this in 4 weeks for lab visit only, if continues on low side at that time may send to endocrinology, but hopefully it will continue to trend upwards.  Please schedule lab visit for 4 weeks, I will enter labs.  Hep C is negative.  Have a great day!!

## 2019-12-04 NOTE — Progress Notes (Signed)
Thyroid recheck due to low TSH

## 2020-01-02 ENCOUNTER — Other Ambulatory Visit: Payer: Self-pay

## 2020-01-15 ENCOUNTER — Other Ambulatory Visit: Payer: Self-pay

## 2020-01-15 ENCOUNTER — Other Ambulatory Visit: Payer: Medicare Other

## 2020-01-15 DIAGNOSIS — R5383 Other fatigue: Secondary | ICD-10-CM | POA: Diagnosis not present

## 2020-01-15 DIAGNOSIS — R7989 Other specified abnormal findings of blood chemistry: Secondary | ICD-10-CM

## 2020-01-16 LAB — THYROID PANEL WITH TSH
Free Thyroxine Index: 2 (ref 1.2–4.9)
T3 Uptake Ratio: 31 % (ref 24–39)
T4, Total: 6.5 ug/dL (ref 4.5–12.0)
TSH: 0.469 u[IU]/mL (ref 0.450–4.500)

## 2020-01-16 NOTE — Progress Notes (Signed)
Please let Joyce Estes know her thyroid labs returned normal on this check and we will continue to monitor at regular visits.  No medications needed.  Have a great day!!

## 2020-05-14 ENCOUNTER — Ambulatory Visit: Payer: Medicare Other | Admitting: Nurse Practitioner

## 2020-05-18 ENCOUNTER — Ambulatory Visit (INDEPENDENT_AMBULATORY_CARE_PROVIDER_SITE_OTHER): Payer: Medicare Other | Admitting: Family Medicine

## 2020-05-18 ENCOUNTER — Encounter: Payer: Self-pay | Admitting: Family Medicine

## 2020-05-18 ENCOUNTER — Other Ambulatory Visit: Payer: Self-pay

## 2020-05-18 VITALS — BP 97/67 | HR 80 | Temp 98.0°F | Wt 129.0 lb

## 2020-05-18 DIAGNOSIS — E785 Hyperlipidemia, unspecified: Secondary | ICD-10-CM

## 2020-05-18 DIAGNOSIS — Z23 Encounter for immunization: Secondary | ICD-10-CM

## 2020-05-18 DIAGNOSIS — E559 Vitamin D deficiency, unspecified: Secondary | ICD-10-CM | POA: Diagnosis not present

## 2020-05-18 NOTE — Assessment & Plan Note (Signed)
Rechecking levels today. Await results. Treat as needed. Call with any concerns.  

## 2020-05-18 NOTE — Progress Notes (Signed)
BP 97/67   Pulse 80   Temp 98 F (36.7 C) (Oral)   Wt 129 lb (58.5 kg)   SpO2 97%   BMI 25.66 kg/m    Subjective:    Patient ID: Joyce Estes, female    DOB: [birth year], 69 y.o.   MRN: [id number]  HPI: Joyce Estes is a 69 y.o. female  Chief Complaint  Patient presents with  . Hyperlipidemia   HYPERLIPIDEMIA Hyperlipidemia status: stable Satisfied with current treatment?  yes Side effects:  Not on anything Past cholesterol meds: none Supplements: none Aspirin:  no The 10-year ASCVD risk score Denman George DC Jr., et al., 2013) is: 5.4%   Values used to calculate the score:     Age: 29 years     Sex: Female     Is Non-Hispanic African American: No     Diabetic: No     Tobacco smoker: No     Systolic Blood Pressure: 97 mmHg     Is BP treated: No     HDL Cholesterol: 69 mg/dL     Total Cholesterol: 206 mg/dL Chest pain:  no Coronary artery disease:  no  Relevant past medical, surgical, family and social history reviewed and updated as indicated. Interim medical history since our last visit reviewed. Allergies and medications reviewed and updated.  Review of Systems  Constitutional: Negative.   Respiratory: Negative.   Cardiovascular: Negative.   Gastrointestinal: Negative.   Musculoskeletal: Negative.   Psychiatric/Behavioral: Positive for sleep disturbance. Negative for agitation, behavioral problems, confusion, decreased concentration, dysphoric mood, hallucinations, self-injury and suicidal ideas. The patient is not nervous/anxious and is not hyperactive.     Per HPI unless specifically indicated above     Objective:    BP 97/67   Pulse 80   Temp 98 F (36.7 C) (Oral)   Wt 129 lb (58.5 kg)   SpO2 97%   BMI 25.66 kg/m   Wt Readings from Last 3 Encounters:  05/18/20 129 lb (58.5 kg)  12/03/19 129 lb (58.5 kg)  11/12/19 130 lb 6.4 oz (59.1 kg)    Physical Exam Vitals and nursing note reviewed.  Constitutional:      General: She is not in  acute distress.    Appearance: Normal appearance. She is not ill-appearing, toxic-appearing or diaphoretic.  HENT:     Head: Normocephalic and atraumatic.     Right Ear: External ear normal.     Left Ear: External ear normal.     Nose: Nose normal.     Mouth/Throat:     Mouth: Mucous membranes are moist.     Pharynx: Oropharynx is clear.  Eyes:     General: No scleral icterus.       Right eye: No discharge.        Left eye: No discharge.     Extraocular Movements: Extraocular movements intact.     Conjunctiva/sclera: Conjunctivae normal.     Pupils: Pupils are equal, round, and reactive to light.  Cardiovascular:     Rate and Rhythm: Normal rate and regular rhythm.     Pulses: Normal pulses.     Heart sounds: Normal heart sounds. No murmur heard.  No friction rub. No gallop.   Pulmonary:     Effort: Pulmonary effort is normal. No respiratory distress.     Breath sounds: Normal breath sounds. No stridor. No wheezing, rhonchi or rales.  Chest:     Chest wall: No tenderness.  Musculoskeletal:  General: Normal range of motion.     Cervical back: Normal range of motion and neck supple.  Skin:    General: Skin is warm and dry.     Capillary Refill: Capillary refill takes less than 2 seconds.     Coloration: Skin is not jaundiced or pale.     Findings: No bruising, erythema, lesion or rash.  Neurological:     General: No focal deficit present.     Mental Status: She is alert and oriented to person, place, and time. Mental status is at baseline.  Psychiatric:        Mood and Affect: Mood normal.        Behavior: Behavior normal.        Thought Content: Thought content normal.        Judgment: Judgment normal.     Results for orders placed or performed in visit on 01/15/20  Thyroid Panel With TSH  Result Value Ref Range   TSH 0.469 0.450 - 4.500 uIU/mL   T4, Total 6.5 4.5 - 12.0 ug/dL   T3 Uptake Ratio 31 24 - 39 %   Free Thyroxine Index 2.0 1.2 - 4.9        Assessment & Plan:   Problem List Items Addressed This Visit      Other   Hyperlipidemia LDL goal <100 - Primary    Rechecking levels today. Await results. Treat as needed. Call with any concerns.       Relevant Orders   Comprehensive metabolic panel   Lipid Panel w/o Chol/HDL Ratio   Vitamin D deficiency    Rechecking levels today. Await results. Treat as needed. Call with any concerns.       Relevant Orders   VITAMIN D 25 Hydroxy (Vit-D Deficiency, Fractures)    Other Visit Diagnoses    Flu vaccine need       Flu shot given today.   Relevant Orders   Flu Vaccine QUAD High Dose(Fluad) (Completed)       Follow up plan: Return in about 6 months (around 11/15/2020) for Physical with Jolene.

## 2020-05-19 LAB — COMPREHENSIVE METABOLIC PANEL
ALT: 8 IU/L (ref 0–32)
AST: 15 IU/L (ref 0–40)
Albumin/Globulin Ratio: 1.4 (ref 1.2–2.2)
Albumin: 4.1 g/dL (ref 3.8–4.8)
Alkaline Phosphatase: 107 IU/L (ref 44–121)
BUN/Creatinine Ratio: 18 (ref 12–28)
BUN: 13 mg/dL (ref 8–27)
Bilirubin Total: 0.5 mg/dL (ref 0.0–1.2)
CO2: 21 mmol/L (ref 20–29)
Calcium: 9.4 mg/dL (ref 8.7–10.3)
Chloride: 105 mmol/L (ref 96–106)
Creatinine, Ser: 0.73 mg/dL (ref 0.57–1.00)
GFR calc Af Amer: 96 mL/min/{1.73_m2} (ref >59)
GFR calc non Af Amer: 84 mL/min/{1.73_m2} (ref >59)
Globulin, Total: 2.9 g/dL (ref 1.5–4.5)
Glucose: 140 mg/dL — ABNORMAL HIGH (ref 65–99)
Potassium: 3.9 mmol/L (ref 3.5–5.2)
Sodium: 139 mmol/L (ref 134–144)
Total Protein: 7 g/dL (ref 6.0–8.5)

## 2020-05-19 LAB — LIPID PANEL W/O CHOL/HDL RATIO
Cholesterol, Total: 192 mg/dL (ref 100–199)
HDL: 71 mg/dL (ref >39)
LDL Chol Calc (NIH): 108 mg/dL — ABNORMAL HIGH (ref 0–99)
Triglycerides: 69 mg/dL (ref 0–149)
VLDL Cholesterol Cal: 13 mg/dL (ref 5–40)

## 2020-05-19 LAB — VITAMIN D 25 HYDROXY (VIT D DEFICIENCY, FRACTURES): Vit D, 25-Hydroxy: 31.6 ng/mL (ref 30.0–100.0)

## 2020-05-21 ENCOUNTER — Encounter: Payer: Self-pay | Admitting: Family Medicine

## 2020-11-13 ENCOUNTER — Encounter: Payer: Self-pay | Admitting: Nurse Practitioner

## 2020-11-13 DIAGNOSIS — I700 Atherosclerosis of aorta: Secondary | ICD-10-CM | POA: Insufficient documentation

## 2020-11-15 ENCOUNTER — Encounter: Payer: Medicare Other | Admitting: Nurse Practitioner

## 2020-11-25 ENCOUNTER — Telehealth: Payer: Self-pay | Admitting: Nurse Practitioner

## 2020-11-25 NOTE — Telephone Encounter (Signed)
Copied from CRM [id number]. Topic: Medicare AWV >> Nov 25, 2020  4:22 PM Geraldine Contras wrote: Reason for CRM  Left message- AWVS has been rescheduled from 4/13 to 4/11 @ 2:30 pm and to be completed by phone not in the office-srs

## 2020-12-06 ENCOUNTER — Ambulatory Visit (INDEPENDENT_AMBULATORY_CARE_PROVIDER_SITE_OTHER): Payer: Medicare Other

## 2020-12-06 VITALS — Ht 60.0 in | Wt 125.0 lb

## 2020-12-06 DIAGNOSIS — Z0000 Encounter for general adult medical examination without abnormal findings: Secondary | ICD-10-CM | POA: Diagnosis not present

## 2020-12-06 NOTE — Progress Notes (Signed)
I connected with Joyce Estes today by telephone and verified that I am speaking with the correct person using two identifiers. Location patient: home Location provider: work Persons participating in the virtual visit: Chiante Lavallie, Glenna Durand LPN.   I discussed the limitations, risks, security and privacy concerns of performing an evaluation and management service by telephone and the availability of in person appointments. I also discussed with the patient that there may be a patient responsible charge related to this service. The patient expressed understanding and verbally consented to this telephonic visit.    Interactive audio and video telecommunications were attempted between this provider and patient, however failed, due to patient having technical difficulties OR patient did not have access to video capability.  We continued and completed visit with audio only.     Vital signs may be patient reported or missing.  Subjective:   Joyce Estes is a 70 y.o. female who presents for Medicare Annual (Subsequent) preventive examination.  Review of Systems     Cardiac Risk Factors include: advanced age (>34mn, >>33women);dyslipidemia     Objective:    Today's Vitals   12/06/20 1441  Weight: 125 lb (56.7 kg)  Height: 5' (1.524 m)   Body mass index is 24.41 kg/m.  Advanced Directives 12/06/2020 12/03/2019 07/17/2018 04/20/2017  Does Patient Have a Medical Advance Directive? No No No No  Would patient like information on creating a medical advance directive? - - Yes (MAU/Ambulatory/Procedural Areas - Information given) No - Patient declined    Current Medications (verified) Outpatient Encounter Medications as of 12/06/2020  Medication Sig  . Acetaminophen (TYLENOL ARTHRITIS PAIN PO) Take by mouth every 6 (six) hours.  . cholecalciferol (VITAMIN D3) 25 MCG (1000 UT) tablet Take 1,000 Units by mouth daily.  . Multiple Vitamin (MULTIVITAMIN PO) Take by mouth daily.   No  facility-administered encounter medications on file as of 12/06/2020.    Allergies (verified) Patient has no known allergies.   History: History reviewed. No pertinent past medical history. Past Surgical History:  Procedure Laterality Date  . FOOT SURGERY     Family History  Problem Relation Age of Onset  . Heart disease Mother   . Diabetes Father   . Diabetes Paternal Grandmother    Social History   Socioeconomic History  . Marital status: Single    Spouse name: Not on file  . Number of children: Not on file  . Years of education: Not on file  . Highest education level: Not on file  Occupational History  . Not on file  Tobacco Use  . Smoking status: Never Smoker  . Smokeless tobacco: Never Used  Vaping Use  . Vaping Use: Never used  Substance and Sexual Activity  . Alcohol use: Not Currently  . Drug use: Never  . Sexual activity: Not Currently  Other Topics Concern  . Not on file  Social History Narrative  . Not on file   Social Determinants of Health   Financial Resource Strain: Low Risk   . Difficulty of Paying Living Expenses: Not hard at all  Food Insecurity: No Food Insecurity  . Worried About RCharity fundraiserin the Last Year: Never true  . Ran Out of Food in the Last Year: Never true  Transportation Needs: No Transportation Needs  . Lack of Transportation (Medical): No  . Lack of Transportation (Non-Medical): No  Physical Activity: Insufficiently Active  . Days of Exercise per Week: 3 days  . Minutes of Exercise per  Session: 40 min  Stress: No Stress Concern Present  . Feeling of Stress : Not at all  Social Connections: Not on file    Tobacco Counseling Counseling given: Not Answered   Clinical Intake:  Pre-visit preparation completed: Yes  Pain : No/denies pain     Nutritional Status: BMI of 19-24  Normal Nutritional Risks: None Diabetes: No  How often do you need to have someone help you when you read instructions, pamphlets, or  other written materials from your doctor or pharmacy?: 1 - Never What is the last grade level you completed in school?: associate's degree  Diabetic? no  Interpreter Needed?: No  Information entered by :: NAllen LPN   Activities of Daily Living In your present state of health, do you have any difficulty performing the following activities: 12/06/2020  Hearing? N  Vision? N  Difficulty concentrating or making decisions? N  Walking or climbing stairs? N  Dressing or bathing? N  Doing errands, shopping? N  Preparing Food and eating ? N  Using the Toilet? N  In the past six months, have you accidently leaked urine? Y  Do you have problems with loss of bowel control? N  Managing your Medications? N  Managing your Finances? N  Housekeeping or managing your Housekeeping? N  Some recent data might be hidden    Patient Care Team: Venita Lick, NP as PCP - General (Nurse Practitioner)  Indicate any recent Medical Services you may have received from other than [facility] providers in the past year (date may be approximate).     Assessment:   This is a routine wellness examination for Lady.  Hearing/Vision screen No exam data present  Dietary issues and exercise activities discussed: Current Exercise Habits: Home exercise routine, Type of exercise: yoga;walking (tai chi), Time (Minutes): 40, Frequency (Times/Week): 3, Weekly Exercise (Minutes/Week): 120  Goals    . Patient Stated     12/06/2020, stay healthy      Depression Screen PHQ 2/9 Scores 12/06/2020 12/03/2019 11/12/2019 06/17/2018 06/17/2018  PHQ - 2 Score 0 0 0 0 0  PHQ- 9 Score - - 1 1 1     Fall Risk Fall Risk  12/06/2020 12/03/2019 06/17/2018  Falls in the past year? 0 0 No  Number falls in past yr: - 0 -  Injury with Fall? - 0 -  Risk for fall due to : No Fall Risks - -  Follow up Falls evaluation completed;Education provided;Falls prevention discussed - -    FALL RISK PREVENTION PERTAINING TO THE HOME:  Any  stairs in or around the home? Yes  If so, are there any without handrails? No  Home free of loose throw rugs in walkways, pet beds, electrical cords, etc? Yes  Adequate lighting in your home to reduce risk of falls? Yes   ASSISTIVE DEVICES UTILIZED TO PREVENT FALLS:  Life alert? No  Use of a cane, walker or w/c? No  Grab bars in the bathroom? Yes  Shower chair or bench in shower? Yes  Elevated toilet seat or a handicapped toilet? Yes   TIMED UP AND GO:  Was the test performed? No .  Cognitive Function:     6CIT Screen 12/06/2020 12/03/2019  What Year? 0 points 0 points  What month? 0 points 0 points  What time? 0 points 0 points  Count back from 20 0 points 0 points  Months in reverse 0 points 0 points  Repeat phrase 0 points 0 points  Total Score 0  0    Immunizations Immunization History  Administered Date(s) Administered  . Fluad Quad(high Dose 65+) 05/18/2020  . Influenza, High Dose Seasonal PF 06/17/2018  . Moderna Sars-Covid-2 Vaccination 10/01/2019, 10/29/2019  . Pneumococcal Conjugate-13 06/17/2018  . Pneumococcal Polysaccharide-23 12/03/2019    TDAP status: Due, Education has been provided regarding the importance of this vaccine. Advised may receive this vaccine at local pharmacy or Health Dept. Aware to provide a copy of the vaccination record if obtained from local pharmacy or Health Dept. Verbalized acceptance and understanding.  Flu Vaccine status: Up to date  Pneumococcal vaccine status: Up to date  Covid-19 vaccine status: Completed vaccines  Qualifies for Shingles Vaccine? Yes   Zostavax completed No   Shingrix Completed?: No.    Education has been provided regarding the importance of this vaccine. Patient has been advised to call insurance company to determine out of pocket expense if they have not yet received this vaccine. Advised may also receive vaccine at local pharmacy or Health Dept. Verbalized acceptance and understanding.  Screening  Tests Health Maintenance  Topic Date Due  . TETANUS/TDAP  Never done  . COLONOSCOPY (Pts 45-70yr Insurance coverage will need to be confirmed)  Never done  . MAMMOGRAM  Never done  . DEXA SCAN  Never done  . COVID-19 Vaccine (3 - Booster for Moderna series) 04/30/2020  . INFLUENZA VACCINE  03/28/2021  . Hepatitis C Screening  Completed  . PNA vac Low Risk Adult  Completed  . HPV VACCINES  Aged Out    Health Maintenance  Health Maintenance Due  Topic Date Due  . TETANUS/TDAP  Never done  . COLONOSCOPY (Pts 45-418yrInsurance coverage will need to be confirmed)  Never done  . MAMMOGRAM  Never done  . DEXA SCAN  Never done  . COVID-19 Vaccine (3 - Booster for Moderna series) 04/30/2020    Colorectal cancer screening: due  Mammogram status: due  Bone Density status: due  Lung Cancer Screening: (Low Dose CT Chest recommended if Age 72-80ears, 30 pack-year currently smoking OR have quit w/in 15years.) does not qualify.   Lung Cancer Screening Referral: no  Additional Screening:  Hepatitis C Screening: does qualify; Completed 12/03/2019  Vision Screening: Recommended annual ophthalmology exams for early detection of glaucoma and other disorders of the eye. Is the patient up to date with their annual eye exam?  No  Who is the provider or what is the name of the office in which the patient attends annual eye exams? none If pt is not established with a provider, would they like to be referred to a provider to establish care? No .   Dental Screening: Recommended annual dental exams for proper oral hygiene  Community Resource Referral / Chronic Care Management: CRR required this visit?  No   CCM required this visit?  No      Plan:     I have personally reviewed and noted the following in the patient's chart:   . Medical and social history . Use of alcohol, tobacco or illicit drugs  . Current medications and supplements . Functional ability and status . Nutritional  status . Physical activity . Advanced directives . List of other physicians . Hospitalizations, surgeries, and ER visits in previous 12 months . Vitals . Screenings to include cognitive, depression, and falls . Referrals and appointments  In addition, I have reviewed and discussed with patient certain preventive protocols, quality metrics, and best practice recommendations. A written personalized care plan for preventive  services as well as general preventive health recommendations were provided to patient.     Kellie Simmering, LPN   10/26/4157   Nurse Notes:

## 2020-12-06 NOTE — Patient Instructions (Signed)
Joyce Estes , Thank you for taking time to come for your Medicare Wellness Visit. I appreciate your ongoing commitment to your health goals. Please review the following plan we discussed and let me know if I can assist you in the future.   Screening recommendations/referrals: Colonoscopy: due Mammogram: due Bone Density: due Recommended yearly ophthalmology/optometry visit for glaucoma screening and checkup Recommended yearly dental visit for hygiene and checkup  Vaccinations: Influenza vaccine: completed 05/18/2020, due 03/28/2021 Pneumococcal vaccine: completed 12/03/2019 Tdap vaccine: due Shingles vaccine: discussed   Covid-19: 10/29/2019, 10/01/2019  Advanced directives: Advance directive discussed with you today.  Conditions/risks identified: none  Next appointment: Follow up in one year for your annual wellness visit    Preventive Care 65 Years and Older, Female Preventive care refers to lifestyle choices and visits with your health care provider that can promote health and wellness. What does preventive care include?  A yearly physical exam. This is also called an annual well check.  Dental exams once or twice a year.  Routine eye exams. Ask your health care provider how often you should have your eyes checked.  Personal lifestyle choices, including:  Daily care of your teeth and gums.  Regular physical activity.  Eating a healthy diet.  Avoiding tobacco and drug use.  Limiting alcohol use.  Practicing safe sex.  Taking low-dose aspirin every day.  Taking vitamin and mineral supplements as recommended by your health care provider. What happens during an annual well check? The services and screenings done by your health care provider during your annual well check will depend on your age, overall health, lifestyle risk factors, and family history of disease. Counseling  Your health care provider may ask you questions about your:  Alcohol use.  Tobacco use.  Drug  use.  Emotional well-being.  Home and relationship well-being.  Sexual activity.  Eating habits.  History of falls.  Memory and ability to understand (cognition).  Work and work Astronomer.  Reproductive health. Screening  You may have the following tests or measurements:  Height, weight, and BMI.  Blood pressure.  Lipid and cholesterol levels. These may be checked every 5 years, or more frequently if you are over 72 years old.  Skin check.  Lung cancer screening. You may have this screening every year starting at age 72 if you have a 30-pack-year history of smoking and currently smoke or have quit within the past 15 years.  Fecal occult blood test (FOBT) of the stool. You may have this test every year starting at age 72.  Flexible sigmoidoscopy or colonoscopy. You may have a sigmoidoscopy every 5 years or a colonoscopy every 10 years starting at age 72.  Hepatitis C blood test.  Hepatitis B blood test.  Sexually transmitted disease (STD) testing.  Diabetes screening. This is done by checking your blood sugar (glucose) after you have not eaten for a while (fasting). You may have this done every 1-3 years.  Bone density scan. This is done to screen for osteoporosis. You may have this done starting at age 72.  Mammogram. This may be done every 1-2 years. Talk to your health care provider about how often you should have regular mammograms. Talk with your health care provider about your test results, treatment options, and if necessary, the need for more tests. Vaccines  Your health care provider may recommend certain vaccines, such as:  Influenza vaccine. This is recommended every year.  Tetanus, diphtheria, and acellular pertussis (Tdap, Td) vaccine. You may need a  Td booster every 10 years.  Zoster vaccine. You may need this after age 72.  Pneumococcal 13-valent conjugate (PCV13) vaccine. One dose is recommended after age 72.  Pneumococcal polysaccharide  (PPSV23) vaccine. One dose is recommended after age 72. Talk to your health care provider about which screenings and vaccines you need and how often you need them. This information is not intended to replace advice given to you by your health care provider. Make sure you discuss any questions you have with your health care provider. Document Released: 09/10/2015 Document Revised: 05/03/2016 Document Reviewed: 06/15/2015 Elsevier Interactive Patient Education  2017 [place] Prevention in the Home Falls can cause injuries. They can happen to people of all ages. There are many things you can do to make your home safe and to help prevent falls. What can I do on the outside of my home?  Regularly fix the edges of walkways and driveways and fix any cracks.  Remove anything that might make you trip as you walk through a door, such as a raised step or threshold.  Trim any bushes or trees on the path to your home.  Use bright outdoor lighting.  Clear any walking paths of anything that might make someone trip, such as rocks or tools.  Regularly check to see if handrails are loose or broken. Make sure that both sides of any steps have handrails.  Any raised decks and porches should have guardrails on the edges.  Have any leaves, snow, or ice cleared regularly.  Use sand or salt on walking paths during winter.  Clean up any spills in your garage right away. This includes oil or grease spills. What can I do in the bathroom?  Use night lights.  Install grab bars by the toilet and in the tub and shower. Do not use towel bars as grab bars.  Use non-skid mats or decals in the tub or shower.  If you need to sit down in the shower, use a plastic, non-slip stool.  Keep the floor dry. Clean up any water that spills on the floor as soon as it happens.  Remove soap buildup in the tub or shower regularly.  Attach bath mats securely with double-sided non-slip rug tape.  Do not have  throw rugs and other things on the floor that can make you trip. What can I do in the bedroom?  Use night lights.  Make sure that you have a light by your bed that is easy to reach.  Do not use any sheets or blankets that are too big for your bed. They should not hang down onto the floor.  Have a firm chair that has side arms. You can use this for support while you get dressed.  Do not have throw rugs and other things on the floor that can make you trip. What can I do in the kitchen?  Clean up any spills right away.  Avoid walking on wet floors.  Keep items that you use a lot in easy-to-reach places.  If you need to reach something above you, use a strong step stool that has a grab bar.  Keep electrical cords out of the way.  Do not use floor polish or wax that makes floors slippery. If you must use wax, use non-skid floor wax.  Do not have throw rugs and other things on the floor that can make you trip. What can I do with my stairs?  Do not leave any items on the stairs.  Make sure that there are handrails on both sides of the stairs and use them. Fix handrails that are broken or loose. Make sure that handrails are as long as the stairways.  Check any carpeting to make sure that it is firmly attached to the stairs. Fix any carpet that is loose or worn.  Avoid having throw rugs at the top or bottom of the stairs. If you do have throw rugs, attach them to the floor with carpet tape.  Make sure that you have a light switch at the top of the stairs and the bottom of the stairs. If you do not have them, ask someone to add them for you. What else can I do to help prevent falls?  Wear shoes that:  Do not have high heels.  Have rubber bottoms.  Are comfortable and fit you well.  Are closed at the toe. Do not wear sandals.  If you use a stepladder:  Make sure that it is fully opened. Do not climb a closed stepladder.  Make sure that both sides of the stepladder are  locked into place.  Ask someone to hold it for you, if possible.  Clearly mark and make sure that you can see:  Any grab bars or handrails.  First and last steps.  Where the edge of each step is.  Use tools that help you move around (mobility aids) if they are needed. These include:  Canes.  Walkers.  Scooters.  Crutches.  Turn on the lights when you go into a dark area. Replace any light bulbs as soon as they burn out.  Set up your furniture so you have a clear path. Avoid moving your furniture around.  If any of your floors are uneven, fix them.  If there are any pets around you, be aware of where they are.  Review your medicines with your doctor. Some medicines can make you feel dizzy. This can increase your chance of falling. Ask your doctor what other things that you can do to help prevent falls. This information is not intended to replace advice given to you by your health care provider. Make sure you discuss any questions you have with your health care provider. Document Released: 06/10/2009 Document Revised: 01/20/2016 Document Reviewed: 09/18/2014 Elsevier Interactive Patient Education  2017 [employer].

## 2020-12-08 ENCOUNTER — Ambulatory Visit: Payer: Medicare Other

## 2021-06-07 DIAGNOSIS — H2513 Age-related nuclear cataract, bilateral: Secondary | ICD-10-CM | POA: Diagnosis not present

## 2021-06-07 DIAGNOSIS — H25043 Posterior subcapsular polar age-related cataract, bilateral: Secondary | ICD-10-CM | POA: Diagnosis not present

## 2021-06-07 DIAGNOSIS — H02032 Senile entropion of right lower eyelid: Secondary | ICD-10-CM | POA: Diagnosis not present

## 2021-08-01 ENCOUNTER — Ambulatory Visit: Payer: Self-pay

## 2021-08-01 NOTE — Telephone Encounter (Signed)
Scheduled appointment changed from MyChart Video to Telephone Visit.

## 2021-08-01 NOTE — Telephone Encounter (Signed)
Pt called in stating that she tested COVID positive on Saturday 07/30/21. 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 She denies being exposed to anyone with COVID. Pt reports symptoms of fever, scratchy throat, cough, and back pain. She does say that symptoms are improving since taking Tylenol cough and cold and using throat spray. Pt says she is vaccinated as well. Pt has been out of work since Wednesday. Pt is wanting to know if there is anything else she can take to help with her symptoms. Scheduled pt a virtual appt 08/02/21 at 1040. Pt says she doesn't have mychart and wants to see if someone can call her since she doesn't have transportation. Pt wants to be called at [phone] for her appt. Care advice given and pt verbalized understanding. No other questions/concerns noted.     Reason for Disposition  [1] COVID-19 infection suspected by caller or triager AND [2] mild symptoms (cough, fever, or others) AND [3] negative COVID-19 rapid test  Answer Assessment - Initial Assessment Questions 1. COVID-19 DIAGNOSIS: "Who made your COVID-19 diagnosis?" "Was it confirmed by a positive lab test or self-test?" If not diagnosed by a doctor (or NP/PA), ask "Are there lots of cases (community spread) where you live?" Note: See public health department website, if unsure.    Works at [employer] and took test there 2. COVID-19 EXPOSURE: "Was there any known exposure to COVID before the symptoms began?" CDC Definition of close contact: within 6 feet (2 meters) for a total of 15 minutes or more over a 24-hour period.      No 3. ONSET: "When did the COVID-19 symptoms start?"      Wednesday  4. WORST SYMPTOM: "What is your worst symptom?" (e.g., cough, fever, shortness of breath, muscle aches)     Fever, scratchy throat, cough, back pain 5. COUGH: "Do you have a cough?" If Yes, ask: "How bad is the cough?"       Yes, nonproductive  6. FEVER: "Do you have a fever?" If Yes, ask: "What  is your temperature, how was it measured, and when did it start?"     Low grade  7. RESPIRATORY STATUS: "Describe your breathing?" (e.g., shortness of breath, wheezing, unable to speak)      No 8. BETTER-SAME-WORSE: "Are you getting better, staying the same or getting worse compared to yesterday?"  If getting worse, ask, "In what way?"     Getting better 9. HIGH RISK DISEASE: "Do you have any chronic medical problems?" (e.g., asthma, heart or lung disease, weak immune system, obesity, etc.)     No 10. VACCINE: "Have you had the COVID-19 vaccine?" If Yes, ask: "Which one, how many shots, when did you get it?"       yes 11. BOOSTER: "Have you received your COVID-19 booster?" If Yes, ask: "Which one and when did you get it?"       yes 12. PREGNANCY: "Is there any chance you are pregnant?" "When was your last menstrual period?"       No 13. OTHER SYMPTOMS: "Do you have any other symptoms?"  (e.g., chills, fatigue, headache, loss of smell or taste, muscle pain, sore throat)       Muscle pain and headache and sore throat  14. O2 SATURATION MONITOR:  "Do you use an oxygen saturation monitor (pulse oximeter) at home?" If Yes, ask "What is your reading (oxygen level) today?" "What is your usual oxygen saturation reading?" (e.g., 95%)  No  Protocols used: Coronavirus (MMEEG-56) Diagnosed or Suspected-A-AH

## 2021-08-01 NOTE — Telephone Encounter (Signed)
Patient called , tested positive for covid Saturday, she has sore throat and congestion, wants to know what she can take for it. Please call back    Left message to call back.

## 2021-08-02 ENCOUNTER — Encounter: Payer: Self-pay | Admitting: Nurse Practitioner

## 2021-08-02 ENCOUNTER — Ambulatory Visit (INDEPENDENT_AMBULATORY_CARE_PROVIDER_SITE_OTHER): Payer: Medicare Other | Admitting: Nurse Practitioner

## 2021-08-02 DIAGNOSIS — U071 COVID-19: Secondary | ICD-10-CM

## 2021-08-02 MED ORDER — PREDNISONE 10 MG PO TABS: ORAL_TABLET | ORAL | 0 refills | Status: DC

## 2021-08-02 NOTE — Progress Notes (Signed)
Acute Office Visit  Subjective:    Patient ID: Joyce Estes, female    DOB: January 30, 1951, 70 y.o.   MRN: [id number]  Chief Complaint  Patient presents with   Covid Positive    Tested positive on Saturday. Symptoms, sore throat, headache, nasal congestion, cough, body aches. Has been taking OTC Tylenol sever cold and flu cough medicine, chloraseptic  spray or the sore throat.    HPI Patient is in today for scratchy throat and nasal congestion that started on Wednesday. She had a positive covid-19 test on Saturday.  UPPER RESPIRATORY TRACT INFECTION  Worst symptom: nasal Fever: no Cough: yes Shortness of breath: no Wheezing: no Chest pain: no Chest tightness: no Chest congestion: no Nasal congestion: yes Runny nose: yes Post nasal drip: no Sneezing: no Sore throat: yes Swollen glands: no Sinus pressure: yes Headache: yes Face pain: no Toothache: no Ear pain: yes left Ear pressure: no  Eyes red/itching:yes Eye drainage/crusting: no  Vomiting: no Rash: no Fatigue: yes Sick contacts: no Strep contacts: no  Context: better Recurrent sinusitis: no Relief with OTC cold/cough medications: yes  Treatments attempted: tylenol severe cold and flu, chloraseptic throat spray    History reviewed. No pertinent past medical history.  Past Surgical History:  Procedure Laterality Date   FOOT SURGERY      Family History  Problem Relation Age of Onset   Heart disease Mother    Diabetes Father    Diabetes Paternal Grandmother     Social History   Socioeconomic History   Marital status: Single    Spouse name: Not on file   Number of children: Not on file   Years of education: Not on file   Highest education level: Not on file  Occupational History   Not on file  Tobacco Use   Smoking status: Never   Smokeless tobacco: Never  Vaping Use   Vaping Use: Never used  Substance and Sexual Activity   Alcohol use: Not Currently   Drug use: Never   Sexual  activity: Not Currently  Other Topics Concern   Not on file  Social History Narrative   Not on file   Social Determinants of Health   Financial Resource Strain: Low Risk    Difficulty of Paying Living Expenses: Not hard at all  Food Insecurity: No Food Insecurity   Worried About Programme researcher, broadcasting/film/video in the Last Year: Never true   Ran Out of Food in the Last Year: Never true  Transportation Needs: No Transportation Needs   Lack of Transportation (Medical): No   Lack of Transportation (Non-Medical): No  Physical Activity: Insufficiently Active   Days of Exercise per Week: 3 days   Minutes of Exercise per Session: 40 min  Stress: No Stress Concern Present   Feeling of Stress : Not at all  Social Connections: Not on file  Intimate Partner Violence: Not on file    Outpatient Medications Prior to Visit  Medication Sig Dispense Refill   Acetaminophen (TYLENOL ARTHRITIS PAIN PO) Take by mouth every 6 (six) hours.     cholecalciferol (VITAMIN D3) 25 MCG (1000 UT) tablet Take 1,000 Units by mouth daily.     Multiple Vitamin (MULTIVITAMIN PO) Take by mouth daily.     No facility-administered medications prior to visit.    No Known Allergies  Review of Systems  Constitutional:  Positive for fatigue. Negative for fever.  HENT:  Positive for congestion, postnasal drip, rhinorrhea, sneezing and sore throat. Negative for  ear pain and sinus pressure.   Eyes: Negative.   Respiratory:  Positive for cough. Negative for shortness of breath.   Cardiovascular: Negative.   Gastrointestinal: Negative.   Endocrine: Negative.   Genitourinary: Negative.   Musculoskeletal:  Positive for myalgias.  Skin: Negative.   Neurological:  Positive for headaches. Negative for dizziness.  Psychiatric/Behavioral: Negative.        Objective:    Physical Exam Vitals and nursing note reviewed.  Pulmonary:     Effort: Pulmonary effort is normal.     Comments: Able to talk in complete  sentences Neurological:     Mental Status: She is oriented to person, place, and time.  Psychiatric:        Thought Content: Thought content normal.    There were no vitals taken for this visit. Wt Readings from Last 3 Encounters:  12/06/20 125 lb (56.7 kg)  05/18/20 129 lb (58.5 kg)  12/03/19 129 lb (58.5 kg)    Health Maintenance Due  Topic Date Due   TETANUS/TDAP  Never done   COLONOSCOPY (Pts 45-50yrs Insurance coverage will need to be confirmed)  Never done   MAMMOGRAM  Never done   Zoster Vaccines- Shingrix (1 of 2) Never done   DEXA SCAN  Never done   COVID-19 Vaccine (3 - Booster for Moderna series) 12/24/2019   INFLUENZA VACCINE  03/28/2021    There are no preventive care reminders to display for this patient.   Lab Results  Component Value Date   TSH 0.469 01/15/2020   Lab Results  Component Value Date   WBC 5.5 11/12/2019   HGB 14.3 11/12/2019   HCT 41.2 11/12/2019   MCV 93 11/12/2019   PLT 327 11/12/2019   Lab Results  Component Value Date   NA 139 05/18/2020   K 3.9 05/18/2020   CO2 21 05/18/2020   GLUCOSE 140 (H) 05/18/2020   BUN 13 05/18/2020   CREATININE 0.73 05/18/2020   BILITOT 0.5 05/18/2020   ALKPHOS 107 05/18/2020   AST 15 05/18/2020   ALT 8 05/18/2020   PROT 7.0 05/18/2020   ALBUMIN 4.1 05/18/2020   CALCIUM 9.4 05/18/2020   ANIONGAP 8 04/20/2017   Lab Results  Component Value Date   CHOL 192 05/18/2020   Lab Results  Component Value Date   HDL 71 05/18/2020   Lab Results  Component Value Date   LDLCALC 108 (H) 05/18/2020   Lab Results  Component Value Date   TRIG 69 05/18/2020   No results found for: CHOLHDL Lab Results  Component Value Date   HGBA1C 5.4 07/03/2018       Assessment & Plan:   Problem List Items Addressed This Visit   None Visit Diagnoses     COVID-19    -  Primary   Outside treatment window for oral antivirals. Will give prednsione taper. Continue OTC medications. Work note given. Discussed  isolation. Enc. rest, fluids       Meds ordered this encounter  Medications   predniSONE (DELTASONE) 10 MG tablet    Sig: Take 6 tablets today, then 5 tablets tomorrow, then decrease by 1 tablet every day until gone    Dispense:  21 tablet    Refill:  0     This visit was completed via telephone due to the restrictions of the COVID-19 pandemic. All issues as above were discussed and addressed but no physical exam was performed. If it was felt that the patient should be evaluated in the  office, they were directed there. The patient verbally consented to this visit. Patient was unable to complete an audio/visual visit due to Technical difficulties", "Lack of internet. Due to the catastrophic nature of the COVID-19 pandemic, this visit was done through audio contact only. Location of the patient: home Location of the provider: work Those involved with this call:  Provider: Rodman Pickle, NP CMA: Tristan Schroeder, CMA Front Desk/Registration: Channing Mutters  Time spent on call:  10 minutes on the phone discussing health concerns. 10 minutes total spent in review of patient's record and preparation of their chart.   Gerre Scull, NP

## 2021-11-23 ENCOUNTER — Encounter: Payer: Medicare Other | Admitting: Nurse Practitioner

## 2021-11-26 NOTE — Patient Instructions (Addendum)
Please call to schedule your mammogram and/or bone density: ?[facility]  ?Address: [address], [place], [place] [address] ?Phone: [phone]  ? ?High Cholesterol ?High cholesterol is a condition in which the blood has high levels of a white, waxy substance similar to fat (cholesterol). The liver makes all the cholesterol that the body needs. The human body needs small amounts of cholesterol to help build cells. A person gets extra or excess cholesterol from the food that he or she eats. ?The blood carries cholesterol from the liver to the rest of the body. If you have high cholesterol, deposits (plaques) may build up on the walls of your arteries. Arteries are the blood vessels that carry blood away from your heart. These plaques make the arteries narrow and stiff. ?Cholesterol plaques increase your risk for heart attack and stroke. Work with your health care provider to keep your cholesterol levels in a healthy range. ?What increases the risk? ?The following factors may make you more likely to develop this condition: ?Eating foods that are high in animal fat (saturated fat) or cholesterol. ?Being overweight. ?Not getting enough exercise. ?A family history of high cholesterol (familial hypercholesterolemia). ?Use of tobacco products. ?Having diabetes. ?What are the signs or symptoms? ?In most cases, high cholesterol does not usually cause any symptoms. ?In severe cases, very high cholesterol levels can cause: ?Fatty bumps under the skin (xanthomas). ?A white or gray ring around the black center (pupil) of the eye. ?How is this diagnosed? ?This condition may be diagnosed based on the results of a blood test. ?If you are older than 73 years of age, your health care provider may check your cholesterol levels every 4-6 years. ?You may be checked more often if you have high cholesterol or other risk factors for heart disease. ?The blood test for cholesterol  measures: ?"Bad" cholesterol, or LDL cholesterol. This is the main type of cholesterol that causes heart disease. The desired level is less than 100 mg/dL (4.68 mmol/L). ?"Good" cholesterol, or HDL cholesterol. HDL helps protect against heart disease by cleaning the arteries and carrying the LDL to the liver for processing. The desired level for HDL is 60 mg/dL (0.32 mmol/L) or higher. ?Triglycerides. These are fats that your body can store or burn for energy. The desired level is less than 150 mg/dL (1.22 mmol/L). ?Total cholesterol. This measures the total amount of cholesterol in your blood and includes LDL, HDL, and triglycerides. The desired level is less than 200 mg/dL (4.82 mmol/L). ?How is this treated? ?Treatment for high cholesterol starts with lifestyle changes, such as diet and exercise. ?Diet changes. You may be asked to eat foods that have more fiber and less saturated fats or added sugar. ?Lifestyle changes. These may include regular exercise, maintaining a healthy weight, and quitting use of tobacco products. ?Medicines. These are given when diet and lifestyle changes have not worked. You may be prescribed a statin medicine to help lower your cholesterol levels. ?Follow these instructions at home: ?Eating and drinking ? ?Eat a healthy, balanced diet. This diet includes: ?Daily servings of a variety of fresh, frozen, or canned fruits and vegetables. ?Daily servings of whole grain foods that are rich in fiber. ?Foods that are low in saturated fats and trans fats. These include poultry and fish without skin, lean cuts of meat, and low-fat dairy products. ?A variety of fish, especially oily fish that contain omega-3 fatty acids. Aim to eat fish at least 2 times a  week. ?Avoid foods and drinks that have added sugar. ?Use healthy cooking methods, such as roasting, grilling, broiling, baking, poaching, steaming, and stir-frying. Do not fry your food except for stir-frying. ?If you drink alcohol: ?Limit how  much you have to: ?0-1 drink a day for women who are not pregnant. ?0-2 drinks a day for men. ?Know how much alcohol is in a drink. In the [place] one drink equals one 12 oz bottle of beer (355 mL), one 5 oz glass of wine (148 mL), or one 1? oz glass of hard liquor (44 mL). ?Lifestyle ? ?Get regular exercise. Aim to exercise for a total of 150 minutes a week. Increase your activity level by doing activities such as gardening, walking, and taking the stairs. ?Do not use any products that contain nicotine or tobacco. These products include cigarettes, chewing tobacco, and vaping devices, such as e-cigarettes. If you need help quitting, ask your health care provider. ?General instructions ?Take over-the-counter and prescription medicines only as told by your health care provider. ?Keep all follow-up visits. This is important. ?Where to find more information ?American Heart Association: www.heart.org ?National Heart, Lung, and Blood Institute: PopSteam.is ?Contact a health care provider if: ?You have trouble achieving or maintaining a healthy diet or weight. ?You are starting an exercise program. ?You are unable to stop smoking. ?Get help right away if: ?You have chest pain. ?You have trouble breathing. ?You have discomfort or pain in your jaw, neck, back, shoulder, or arm. ?You have any symptoms of a stroke. "BE FAST" is an easy way to remember the main warning signs of a stroke: ?B - Balance. Signs are dizziness, sudden trouble walking, or loss of balance. ?E - Eyes. Signs are trouble seeing or a sudden change in vision. ?F - Face. Signs are sudden weakness or numbness of the face, or the face or eyelid drooping on one side. ?A - Arms. Signs are weakness or numbness in an arm. This happens suddenly and usually on one side of the body. ?S - Speech. Signs are sudden trouble speaking, slurred speech, or trouble understanding what people say. ?T - Time. Time to call emergency services. Write down what time symptoms  started. ?You have other signs of a stroke, such as: ?A sudden, severe headache with no known cause. ?Nausea or vomiting. ?Seizure. ?These symptoms may represent a serious problem that is an emergency. Do not wait to see if the symptoms will go away. Get medical help right away. Call your local emergency services (911 in the [place] Do not drive yourself to the hospital. ?Summary ?Cholesterol plaques increase your risk for heart attack and stroke. Work with your health care provider to keep your cholesterol levels in a healthy range. ?Eat a healthy, balanced diet, get regular exercise, and maintain a healthy weight. ?Do not use any products that contain nicotine or tobacco. These products include cigarettes, chewing tobacco, and vaping devices, such as e-cigarettes. ?Get help right away if you have any symptoms of a stroke. ?This information is not intended to replace advice given to you by your health care provider. Make sure you discuss any questions you have with your health care provider. ?Document Revised: 10/28/2020 Document Reviewed: 10/18/2020 ?Elsevier Patient Education ? 2022 Elsevier Inc. ? ?

## 2021-11-28 ENCOUNTER — Encounter: Payer: Self-pay | Admitting: Nurse Practitioner

## 2021-11-28 ENCOUNTER — Ambulatory Visit (INDEPENDENT_AMBULATORY_CARE_PROVIDER_SITE_OTHER): Payer: Medicare Other | Admitting: Nurse Practitioner

## 2021-11-28 VITALS — BP 113/71 | HR 76 | Temp 98.5°F | Ht 60.5 in | Wt 115.6 lb

## 2021-11-28 DIAGNOSIS — Z780 Asymptomatic menopausal state: Secondary | ICD-10-CM

## 2021-11-28 DIAGNOSIS — E785 Hyperlipidemia, unspecified: Secondary | ICD-10-CM | POA: Diagnosis not present

## 2021-11-28 DIAGNOSIS — E559 Vitamin D deficiency, unspecified: Secondary | ICD-10-CM | POA: Diagnosis not present

## 2021-11-28 DIAGNOSIS — Z1211 Encounter for screening for malignant neoplasm of colon: Secondary | ICD-10-CM | POA: Diagnosis not present

## 2021-11-28 DIAGNOSIS — Z1231 Encounter for screening mammogram for malignant neoplasm of breast: Secondary | ICD-10-CM

## 2021-11-28 DIAGNOSIS — Z23 Encounter for immunization: Secondary | ICD-10-CM

## 2021-11-28 DIAGNOSIS — I700 Atherosclerosis of aorta: Secondary | ICD-10-CM | POA: Diagnosis not present

## 2021-11-28 DIAGNOSIS — M170 Bilateral primary osteoarthritis of knee: Secondary | ICD-10-CM | POA: Diagnosis not present

## 2021-11-28 DIAGNOSIS — Z0000 Encounter for general adult medical examination without abnormal findings: Secondary | ICD-10-CM

## 2021-11-28 NOTE — Progress Notes (Signed)
? ?BP 113/71   Pulse 76   Temp 98.5 ?F (36.9 ?C) (Oral)   Ht 5' 0.5" (1.537 m)   Wt 115 lb 9.6 oz (52.4 kg)   SpO2 97%   BMI 22.20 kg/m?   ? ?Subjective:  ? ? Patient ID: Joyce Estes, female    DOB: 10/13/1950, 71 y.o.   MRN: [id number] ? ?HPI: ?Joyce Estes is a 70 y.o. female presenting on 11/28/2021 for comprehensive medical examination. Current medical complaints include:none ? ?She currently lives with: brother ?Menopausal Symptoms: no  ? ?HYPERLIPIDEMIA ?She is not fasting today, had protein shake. ?Hyperlipidemia status: good compliance ?Satisfied with current treatment?  yes ?Side effects:  no ?Medication compliance: good compliance ?Past cholesterol meds: none ?Supplements: none ?Aspirin:  no ?The 10-year ASCVD risk score (Arnett DK, et al., 2019) is: 9% ?  Values used to calculate the score: ?    Age: 48 years ?    Sex: Female ?    Is Non-Hispanic African American: No ?    Diabetic: No ?    Tobacco smoker: No ?    Systolic Blood Pressure: 113 mmHg ?    Is BP treated: No ?    HDL Cholesterol: 71 mg/dL ?    Total Cholesterol: 192 mg/dL ?Chest pain:  no ?Coronary artery disease:  no ?Family history CAD:  no ?Family history early CAD:  no  ? ?ARTHRALGIAS / JOINT ACHES ?Last Vitamin D level 31.6, continues on daily supplement.  Bone density and mammogram ordered in past, has not attended.   ?Decreased function/range of motion: no ?Erythema: none ?Swelling: none ?Heat or warmth: none ?Morning stiffness: none ?Aggravating factors: increased on feet ?Alleviating factors: stretches every day ? ?Depression Screen done today and results listed below:  ? ?  11/28/2021  ?  2:08 PM 12/06/2020  ?  2:47 PM 12/03/2019  ?  2:22 PM 11/12/2019  ?  2:14 PM 06/17/2018  ?  2:09 PM  ?Depression screen PHQ 2/9  ?Decreased Interest  0 0 0 0  ?Down, Depressed, Hopeless 2 0 0 0 0  ?PHQ - 2 Score 2 0 0 0 0  ?Altered sleeping 2   1 1   ?Tired, decreased energy 0   0 0  ?Change in appetite 0   0 0  ?Feeling bad or failure about  yourself  0   0 0  ?Trouble concentrating 0   0 0  ?Moving slowly or fidgety/restless 0   0 0  ?Suicidal thoughts 0   0 0  ?PHQ-9 Score 4   1 1   ?Difficult doing work/chores    Not difficult at all   ? ? ? ?  06/17/2018  ?  2:09 PM 12/03/2019  ?  2:20 PM 12/06/2020  ?  2:47 PM 11/28/2021  ?  2:27 PM  ?Fall Risk  ?Falls in the past year? No 0 0 0  ?Was there an injury with Fall?  0  0  ?Fall Risk Category Calculator  0  0  ?Fall Risk Category  Low  Low  ?Patient Fall Risk Level  Low fall risk Low fall risk Low fall risk  ?Patient at Risk for Falls Due to   No Fall Risks No Fall Risks  ?Fall risk Follow up   Falls evaluation completed;Education provided;Falls prevention discussed Falls evaluation completed  ?  ?Functional Status Survey: ?Is the patient deaf or have difficulty hearing?: No ?Does the patient have difficulty seeing, even when wearing glasses/contacts?: No ?Does  the patient have difficulty concentrating, remembering, or making decisions?: No ?Does the patient have difficulty walking or climbing stairs?: No ?Does the patient have difficulty dressing or bathing?: No ?Does the patient have difficulty doing errands alone such as visiting a doctor's office or shopping?: No  ? ?Past Medical History:  ?History reviewed. No pertinent past medical history. ? ?Surgical History:  ?Past Surgical History:  ?Procedure Laterality Date  ? FOOT SURGERY    ? ? ?Medications:  ?Current Outpatient Medications on File Prior to Visit  ?Medication Sig  ? Acetaminophen (TYLENOL ARTHRITIS PAIN PO) Take by mouth every 6 (six) hours.  ? cholecalciferol (VITAMIN D3) 25 MCG (1000 UT) tablet Take 1,000 Units by mouth daily.  ? Multiple Vitamin (MULTIVITAMIN PO) Take by mouth daily.  ? ?No current facility-administered medications on file prior to visit.  ? ? ?Allergies:  ?No Known Allergies ? ?Social History:  ?Social History  ? ?Socioeconomic History  ? Marital status: Single  ?  Spouse name: Not on file  ? Number of children: Not on file   ? Years of education: Not on file  ? Highest education level: Not on file  ?Occupational History  ? Not on file  ?Tobacco Use  ? Smoking status: Never  ? Smokeless tobacco: Never  ?Vaping Use  ? Vaping Use: Never used  ?Substance and Sexual Activity  ? Alcohol use: Not Currently  ? Drug use: Never  ? Sexual activity: Not Currently  ?Other Topics Concern  ? Not on file  ?Social History Narrative  ? Not on file  ? ?Social Determinants of Health  ? ?Financial Resource Strain: Low Risk   ? Difficulty of Paying Living Expenses: Not hard at all  ?Food Insecurity: No Food Insecurity  ? Worried About Programme researcher, broadcasting/film/video in the Last Year: Never true  ? Ran Out of Food in the Last Year: Never true  ?Transportation Needs: No Transportation Needs  ? Lack of Transportation (Medical): No  ? Lack of Transportation (Non-Medical): No  ?Physical Activity: Insufficiently Active  ? Days of Exercise per Week: 3 days  ? Minutes of Exercise per Session: 40 min  ?Stress: No Stress Concern Present  ? Feeling of Stress : Not at all  ?Social Connections: Not on file  ?Intimate Partner Violence: Not on file  ? ?Social History  ? ?Tobacco Use  ?Smoking Status Never  ?Smokeless Tobacco Never  ? ?Social History  ? ?Substance and Sexual Activity  ?Alcohol Use Not Currently  ? ? ?Family History:  ?Family History  ?Problem Relation Age of Onset  ? Heart disease Mother   ? Diabetes Father   ? Diabetes Paternal Grandmother   ? ? ?Past medical history, surgical history, medications, allergies, family history and social history reviewed with patient today and changes made to appropriate areas of the chart.  ? ?Review of Systems - negative ?All other ROS negative except what is listed above and in the HPI.  ? ?   ?Objective:  ?  ?BP 113/71   Pulse 76   Temp 98.5 ?F (36.9 ?C) (Oral)   Ht 5' 0.5" (1.537 m)   Wt 115 lb 9.6 oz (52.4 kg)   SpO2 97%   BMI 22.20 kg/m?   ?Wt Readings from Last 3 Encounters:  ?11/28/21 115 lb 9.6 oz (52.4 kg)  ?12/06/20  125 lb (56.7 kg)  ?05/18/20 129 lb (58.5 kg)  ?  ?Physical Exam ?Vitals and nursing note reviewed. Exam conducted with a chaperone present.  ?  Constitutional:   ?   General: She is awake. She is not in acute distress. ?   Appearance: She is well-developed. She is not ill-appearing.  ?HENT:  ?   Head: Normocephalic and atraumatic.  ?   Right Ear: Hearing, tympanic membrane, ear canal and external ear normal. No drainage.  ?   Left Ear: Hearing, tympanic membrane, ear canal and external ear normal. No drainage.  ?   Nose: Nose normal.  ?   Right Sinus: No maxillary sinus tenderness or frontal sinus tenderness.  ?   Left Sinus: No maxillary sinus tenderness or frontal sinus tenderness.  ?   Mouth/Throat:  ?   Mouth: Mucous membranes are moist.  ?   Pharynx: Oropharynx is clear. Uvula midline. No pharyngeal swelling, oropharyngeal exudate or posterior oropharyngeal erythema.  ?Eyes:  ?   General: Lids are normal.     ?   Right eye: No discharge.     ?   Left eye: No discharge.  ?   Extraocular Movements: Extraocular movements intact.  ?   Conjunctiva/sclera: Conjunctivae normal.  ?   Pupils: Pupils are equal, round, and reactive to light.  ?   Visual Fields: Right eye visual fields normal and left eye visual fields normal.  ?Neck:  ?   Thyroid: No thyromegaly.  ?   Vascular: No carotid bruit.  ?   Trachea: Trachea normal.  ?Cardiovascular:  ?   Rate and Rhythm: Normal rate and regular rhythm.  ?   Heart sounds: Normal heart sounds. No murmur heard. ?  No gallop.  ?Pulmonary:  ?   Effort: Pulmonary effort is normal. No accessory muscle usage or respiratory distress.  ?   Breath sounds: Normal breath sounds.  ?Chest:  ?Breasts: ?   Right: Normal.  ?   Left: Normal.  ?Abdominal:  ?   General: Bowel sounds are normal.  ?   Palpations: Abdomen is soft. There is no hepatomegaly or splenomegaly.  ?   Tenderness: There is no abdominal tenderness.  ?Musculoskeletal:     ?   General: Normal range of motion.  ?   Cervical back:  Normal range of motion and neck supple.  ?   Right lower leg: No edema.  ?   Left lower leg: No edema.  ?Lymphadenopathy:  ?   Head:  ?   Right side of head: No submental, submandibular, tonsillar, preauricular or poste

## 2021-11-28 NOTE — Assessment & Plan Note (Signed)
Ongoing, taking supplement.  Have recommended scheduling DEXA scan to further assess bone health.  Continue supplement.  Return in 6 months. ?

## 2021-11-28 NOTE — Assessment & Plan Note (Signed)
Chronic, stable at this time.  Recommend continue Vitamin D at home + obtain DEXA.  If worsening consider injection in office. ?

## 2021-11-28 NOTE — Assessment & Plan Note (Signed)
Noted on CT 04/20/2017, would benefit from statin therapy, but wishes to hold off.  Check lipid panel today and if elevations continue to recommend statin. ?

## 2021-11-28 NOTE — Assessment & Plan Note (Signed)
Noted on past labs. ASCVD 9%.  Continue diet focus and recheck lipid panel today.  She is not fasting. ?

## 2021-11-29 ENCOUNTER — Other Ambulatory Visit: Payer: Self-pay | Admitting: Nurse Practitioner

## 2021-11-29 DIAGNOSIS — R7989 Other specified abnormal findings of blood chemistry: Secondary | ICD-10-CM

## 2021-11-29 LAB — LIPID PANEL W/O CHOL/HDL RATIO
Cholesterol, Total: 180 mg/dL (ref 100–199)
HDL: 65 mg/dL (ref >39)
LDL Chol Calc (NIH): 102 mg/dL — ABNORMAL HIGH (ref 0–99)
Triglycerides: 70 mg/dL (ref 0–149)
VLDL Cholesterol Cal: 13 mg/dL (ref 5–40)

## 2021-11-29 LAB — CBC WITH DIFFERENTIAL/PLATELET
Basophils Absolute: 0 10*3/uL (ref 0.0–0.2)
Basos: 1 %
EOS (ABSOLUTE): 0 10*3/uL (ref 0.0–0.4)
Eos: 1 %
Hematocrit: 41.8 % (ref 34.0–46.6)
Hemoglobin: 14.1 g/dL (ref 11.1–15.9)
Immature Grans (Abs): 0 10*3/uL (ref 0.0–0.1)
Immature Granulocytes: 0 %
Lymphocytes Absolute: 1.6 10*3/uL (ref 0.7–3.1)
Lymphs: 19 %
MCH: 31.7 pg (ref 26.6–33.0)
MCHC: 33.7 g/dL (ref 31.5–35.7)
MCV: 94 fL (ref 79–97)
Monocytes Absolute: 0.6 10*3/uL (ref 0.1–0.9)
Monocytes: 7 %
Neutrophils Absolute: 6.3 10*3/uL (ref 1.4–7.0)
Neutrophils: 72 %
Platelets: 317 10*3/uL (ref 150–450)
RBC: 4.45 x10E6/uL (ref 3.77–5.28)
RDW: 11.6 % — ABNORMAL LOW (ref 11.7–15.4)
WBC: 8.6 10*3/uL (ref 3.4–10.8)

## 2021-11-29 LAB — COMPREHENSIVE METABOLIC PANEL
ALT: 11 IU/L (ref 0–32)
AST: 16 IU/L (ref 0–40)
Albumin/Globulin Ratio: 1.4 (ref 1.2–2.2)
Albumin: 4.1 g/dL (ref 3.7–4.7)
Alkaline Phosphatase: 108 IU/L (ref 44–121)
BUN/Creatinine Ratio: 19 (ref 12–28)
BUN: 13 mg/dL (ref 8–27)
Bilirubin Total: 0.5 mg/dL (ref 0.0–1.2)
CO2: 20 mmol/L (ref 20–29)
Calcium: 9.9 mg/dL (ref 8.7–10.3)
Chloride: 104 mmol/L (ref 96–106)
Creatinine, Ser: 0.7 mg/dL (ref 0.57–1.00)
Globulin, Total: 2.9 g/dL (ref 1.5–4.5)
Glucose: 90 mg/dL (ref 70–99)
Potassium: 4.3 mmol/L (ref 3.5–5.2)
Sodium: 139 mmol/L (ref 134–144)
Total Protein: 7 g/dL (ref 6.0–8.5)
eGFR: 92 mL/min/{1.73_m2} (ref >59)

## 2021-11-29 LAB — VITAMIN D 25 HYDROXY (VIT D DEFICIENCY, FRACTURES): Vit D, 25-Hydroxy: 34.9 ng/mL (ref 30.0–100.0)

## 2021-11-29 LAB — TSH: TSH: 0.432 u[IU]/mL — ABNORMAL LOW (ref 0.450–4.500)

## 2021-11-29 NOTE — Progress Notes (Signed)
Contacted via MyChart -- needs lab visit only in 6 weeks please :) ? ?The 10-year ASCVD risk score (Arnett DK, et al., 2019) is: 8.9% ?  Values used to calculate the score: ?    Age: 71 years ?    Sex: Female ?    Is Non-Hispanic African American: No ?    Diabetic: No ?    Tobacco smoker: No ?    Systolic Blood Pressure: 113 mmHg ?    Is BP treated: No ?    HDL Cholesterol: 65 mg/dL ?    Total Cholesterol: 180 mg/dL ? ? ?Good evening Aurea Graff, your labs have returned, overall they remain stable with a couple exceptions: ?- Thyroid lab is low this check, more hyperthyroid (overactive).  I would like to recheck thyroid labs in 6 weeks via an outpatient lab visit please.  My staff will call to schedule this with you. ?- Your LDL is above normal. ?The LDL is the bad cholesterol. ?Over time and in combination with inflammation and other factors, this contributes to plaque which in turn may lead to stroke and/or heart attack down the road. ?Sometimes high LDL is primarily genetic, and people might be eating all the right foods but still have high numbers. ?Other times, there is room for improvement in one's diet and eating healthier can bring this number down and potentially reduce one's risk of heart attack and/or stroke.  ?? ?To reduce your LDL, Remember - more fruits and vegetables, more fish, and limit red meat and dairy products. ?More soy, nuts, beans, barley, lentils, oats and plant sterol ester enriched margarine instead of butter. ?I also encourage eliminating sugar and processed food. ?Remember, shop on the outside of the grocery store and visit your [employer]. ??If you would like to talk with me about dietary changes for your cholesterol, please let me know. We should recheck your cholesterol in 6-12 months. Any questions? ?Keep being stellar!!  Thank you for allowing me to participate in your care.  I appreciate you. ?Kindest regards, ?Nur Rabold ? ?? ?

## 2021-12-04 NOTE — Progress Notes (Signed)
Contacted via MyChart, but she did not view, please call and alert her to result note below + needs lab visit only in 6 weeks please :) ?? ?Good evening Joyce Estes, your labs have returned, overall they remain stable with a couple exceptions: ?- Thyroid lab is low this check, more hyperthyroid (overactive). ?I would like to recheck thyroid labs in 6 weeks via an outpatient lab visit please. ?My staff will call to schedule this with you. ?- Your LDL is above normal. ?The LDL is the bad cholesterol. ?Over time and in combination with inflammation and other factors, this contributes to plaque which in turn may lead to stroke and/or heart attack down the road. ?Sometimes high LDL is primarily genetic, and people might be eating all the right foods but still have high numbers. ?Other times, there is room for improvement in one's diet and eating healthier can bring this number down and potentially reduce one's risk of heart attack and/or stroke.  ?? ?To reduce your LDL, Remember - more fruits and vegetables, more fish, and limit red meat and dairy products. ?More soy, nuts, beans, barley, lentils, oats and plant sterol ester enriched margarine instead of butter. ?I also encourage eliminating sugar and processed food. ?Remember, shop on the outside of the grocery store and visit your [employer]. ??If you would like to talk with me about dietary changes for your cholesterol, please let me know. We should recheck your cholesterol in 6-12 months. Any questions? ?Keep being stellar!! ?Thank you for allowing me to participate in your care. ?I appreciate you. ?Kindest regards, ?Maycen Degregory

## 2021-12-05 NOTE — Progress Notes (Signed)
Pt scheduled for labs

## 2021-12-09 ENCOUNTER — Ambulatory Visit: Payer: Medicare Other

## 2021-12-14 ENCOUNTER — Ambulatory Visit (INDEPENDENT_AMBULATORY_CARE_PROVIDER_SITE_OTHER): Payer: Medicare Other | Admitting: *Deleted

## 2021-12-14 DIAGNOSIS — Z0000 Encounter for general adult medical examination without abnormal findings: Secondary | ICD-10-CM | POA: Diagnosis not present

## 2021-12-14 NOTE — Patient Instructions (Signed)
Ms. Mccrumb , ?Thank you for taking time to come for your Medicare Wellness Visit. I appreciate your ongoing commitment to your health goals. Please review the following plan we discussed and let me know if I can assist you in the future.  ? ?Screening recommendations/referrals: ?Colonoscopy: Education provided ?Mammogram: Education provided ?Bone Density: Education provided ?Recommended yearly ophthalmology/optometry visit for glaucoma screening and checkup ?Recommended yearly dental visit for hygiene and checkup ? ?Vaccinations: ?Influenza vaccine: Education provided ?Pneumococcal vaccine: up to date ?Tdap vaccine: up to date ?Shingles vaccine: Education provided   ? ?Advanced directives: Education provided ? ?Conditions/risks identified:  ? ?Next appointment: 01-12-2022 Labs @ 9:00    05-30-2022 @ 1:40 [place] ? ? ?Preventive Care 1 Years and Older, Female ?Preventive care refers to lifestyle choices and visits with your health care provider that can promote health and wellness. ?What does preventive care include? ?A yearly physical exam. This is also called an annual well check. ?Dental exams once or twice a year. ?Routine eye exams. Ask your health care provider how often you should have your eyes checked. ?Personal lifestyle choices, including: ?Daily care of your teeth and gums. ?Regular physical activity. ?Eating a healthy diet. ?Avoiding tobacco and drug use. ?Limiting alcohol use. ?Practicing safe sex. ?Taking low-dose aspirin every day. ?Taking vitamin and mineral supplements as recommended by your health care provider. ?What happens during an annual well check? ?The services and screenings done by your health care provider during your annual well check will depend on your age, overall health, lifestyle risk factors, and family history of disease. ?Counseling  ?Your health care provider may ask you questions about your: ?Alcohol use. ?Tobacco use. ?Drug use. ?Emotional well-being. ?Home and relationship  well-being. ?Sexual activity. ?Eating habits. ?History of falls. ?Memory and ability to understand (cognition). ?Work and work Astronomer. ?Reproductive health. ?Screening  ?You may have the following tests or measurements: ?Height, weight, and BMI. ?Blood pressure. ?Lipid and cholesterol levels. These may be checked every 5 years, or more frequently if you are over 39 years old. ?Skin check. ?Lung cancer screening. You may have this screening every year starting at age 71 if you have a 30-pack-year history of smoking and currently smoke or have quit within the past 15 years. ?Fecal occult blood test (FOBT) of the stool. You may have this test every year starting at age 79. ?Flexible sigmoidoscopy or colonoscopy. You may have a sigmoidoscopy every 5 years or a colonoscopy every 10 years starting at age 54. ?Hepatitis C blood test. ?Hepatitis B blood test. ?Sexually transmitted disease (STD) testing. ?Diabetes screening. This is done by checking your blood sugar (glucose) after you have not eaten for a while (fasting). You may have this done every 1-3 years. ?Bone density scan. This is done to screen for osteoporosis. You may have this done starting at age 51. ?Mammogram. This may be done every 1-2 years. Talk to your health care provider about how often you should have regular mammograms. ?Talk with your health care provider about your test results, treatment options, and if necessary, the need for more tests. ?Vaccines  ?Your health care provider may recommend certain vaccines, such as: ?Influenza vaccine. This is recommended every year. ?Tetanus, diphtheria, and acellular pertussis (Tdap, Td) vaccine. You may need a Td booster every 10 years. ?Zoster vaccine. You may need this after age 36. ?Pneumococcal 13-valent conjugate (PCV13) vaccine. One dose is recommended after age 56. ?Pneumococcal polysaccharide (PPSV23) vaccine. One dose is recommended after age 47. ?Talk to your  health care provider about which  screenings and vaccines you need and how often you need them. ?This information is not intended to replace advice given to you by your health care provider. Make sure you discuss any questions you have with your health care provider. ?Document Released: 09/10/2015 Document Revised: 05/03/2016 Document Reviewed: 06/15/2015 ?Elsevier Interactive Patient Education ? 2017 [place]. ? ?Fall Prevention in the Home ?Falls can cause injuries. They can happen to people of all ages. There are many things you can do to make your home safe and to help prevent falls. ?What can I do on the outside of my home? ?Regularly fix the edges of walkways and driveways and fix any cracks. ?Remove anything that might make you trip as you walk through a door, such as a raised step or threshold. ?Trim any bushes or trees on the path to your home. ?Use bright outdoor lighting. ?Clear any walking paths of anything that might make someone trip, such as rocks or tools. ?Regularly check to see if handrails are loose or broken. Make sure that both sides of any steps have handrails. ?Any raised decks and porches should have guardrails on the edges. ?Have any leaves, snow, or ice cleared regularly. ?Use sand or salt on walking paths during winter. ?Clean up any spills in your garage right away. This includes oil or grease spills. ?What can I do in the bathroom? ?Use night lights. ?Install grab bars by the toilet and in the tub and shower. Do not use towel bars as grab bars. ?Use non-skid mats or decals in the tub or shower. ?If you need to sit down in the shower, use a plastic, non-slip stool. ?Keep the floor dry. Clean up any water that spills on the floor as soon as it happens. ?Remove soap buildup in the tub or shower regularly. ?Attach bath mats securely with double-sided non-slip rug tape. ?Do not have throw rugs and other things on the floor that can make you trip. ?What can I do in the bedroom? ?Use night lights. ?Make sure that you have a  light by your bed that is easy to reach. ?Do not use any sheets or blankets that are too big for your bed. They should not hang down onto the floor. ?Have a firm chair that has side arms. You can use this for support while you get dressed. ?Do not have throw rugs and other things on the floor that can make you trip. ?What can I do in the kitchen? ?Clean up any spills right away. ?Avoid walking on wet floors. ?Keep items that you use a lot in easy-to-reach places. ?If you need to reach something above you, use a strong step stool that has a grab bar. ?Keep electrical cords out of the way. ?Do not use floor polish or wax that makes floors slippery. If you must use wax, use non-skid floor wax. ?Do not have throw rugs and other things on the floor that can make you trip. ?What can I do with my stairs? ?Do not leave any items on the stairs. ?Make sure that there are handrails on both sides of the stairs and use them. Fix handrails that are broken or loose. Make sure that handrails are as long as the stairways. ?Check any carpeting to make sure that it is firmly attached to the stairs. Fix any carpet that is loose or worn. ?Avoid having throw rugs at the top or bottom of the stairs. If you do have throw rugs, attach them  to the floor with carpet tape. ?Make sure that you have a light switch at the top of the stairs and the bottom of the stairs. If you do not have them, ask someone to add them for you. ?What else can I do to help prevent falls? ?Wear shoes that: ?Do not have high heels. ?Have rubber bottoms. ?Are comfortable and fit you well. ?Are closed at the toe. Do not wear sandals. ?If you use a stepladder: ?Make sure that it is fully opened. Do not climb a closed stepladder. ?Make sure that both sides of the stepladder are locked into place. ?Ask someone to hold it for you, if possible. ?Clearly mark and make sure that you can see: ?Any grab bars or handrails. ?First and last steps. ?Where the edge of each step  is. ?Use tools that help you move around (mobility aids) if they are needed. These include: ?Canes. ?Walkers. ?Scooters. ?Crutches. ?Turn on the lights when you go into a dark area. Replace any light bulbs as soo

## 2021-12-14 NOTE — Progress Notes (Signed)
? ?Subjective:  ? Joyce Estes is a 71 y.o. female who presents for Medicare Annual (Subsequent) preventive examination. ? ?I connected with  Joyce Estes on 12/14/21 by a telephone enabled telemedicine application and verified that I am speaking with the correct person using two identifiers. ?  ?I discussed the limitations of evaluation and management by telemedicine. The patient expressed understanding and agreed to proceed. ? ?Patient location: home ? ?Provider location: Tele-Health not in office ? ? ? ?Review of Systems    ? ?Cardiac Risk Factors include: advanced age (>[id number], 66>65 women);family history of premature cardiovascular disease ? ?   ?Objective:  ?  ?Today's Vitals  ? ?There is no height or weight on file to calculate BMI. ? ? ?  12/06/2020  ?  2:46 PM 12/03/2019  ?  2:24 PM 07/17/2018  ?  3:29 PM 04/20/2017  ?  6:45 PM  ?Advanced Directives  ?Does Patient Have a Medical Advance Directive? No No No No  ?Would patient like information on creating a medical advance directive?   Yes (MAU/Ambulatory/Procedural Areas - Information given) No - Patient declined  ? ? ?Current Medications (verified) ?Outpatient Encounter Medications as of 12/14/2021  ?Medication Sig  ? Acetaminophen (TYLENOL ARTHRITIS PAIN PO) Take by mouth every 6 (six) hours.  ? cholecalciferol (VITAMIN D3) 25 MCG (1000 UT) tablet Take 1,000 Units by mouth daily.  ? Multiple Vitamin (MULTIVITAMIN PO) Take by mouth daily.  ? ?No facility-administered encounter medications on file as of 12/14/2021.  ? ? ?Allergies (verified) ?Patient has no known allergies.  ? ?History: ?History reviewed. No pertinent past medical history. ?Past Surgical History:  ?Procedure Laterality Date  ? FOOT SURGERY    ? ?Family History  ?Problem Relation Age of Onset  ? Heart disease Mother   ? Diabetes Father   ? Diabetes Paternal Grandmother   ? ?Social History  ? ?Socioeconomic History  ? Marital status: Single  ?  Spouse name: Not on file  ? Number of children:  Not on file  ? Years of education: Not on file  ? Highest education level: Not on file  ?Occupational History  ? Not on file  ?Tobacco Use  ? Smoking status: Never  ? Smokeless tobacco: Never  ?Vaping Use  ? Vaping Use: Never used  ?Substance and Sexual Activity  ? Alcohol use: Not Currently  ? Drug use: Never  ? Sexual activity: Not Currently  ?Other Topics Concern  ? Not on file  ?Social History Narrative  ? Not on file  ? ?Social Determinants of Health  ? ?Financial Resource Strain: Low Risk   ? Difficulty of Paying Living Expenses: Not hard at all  ?Food Insecurity: No Food Insecurity  ? Worried About Programme researcher, broadcasting/film/videounning Out of Food in the Last Year: Never true  ? Ran Out of Food in the Last Year: Never true  ?Transportation Needs: No Transportation Needs  ? Lack of Transportation (Medical): No  ? Lack of Transportation (Non-Medical): No  ?Physical Activity: Insufficiently Active  ? Days of Exercise per Week: 4 days  ? Minutes of Exercise per Session: 30 min  ?Stress: No Stress Concern Present  ? Feeling of Stress : Not at all  ?Social Connections: Socially Isolated  ? Frequency of Communication with Friends and Family: More than three times a week  ? Frequency of Social Gatherings with Friends and Family: Once a week  ? Attends Religious Services: Never  ? Active Member of Clubs or Organizations: No  ?  Attends Banker Meetings: Never  ? Marital Status: Widowed  ? ? ?Tobacco Counseling ?Counseling given: Not Answered ? ? ?Clinical Intake: ? ?Pre-visit preparation completed: Yes ? ?Pain : No/denies pain ? ?  ? ?Nutritional Risks: None ?Diabetes: No ? ?How often do you need to have someone help you when you read instructions, pamphlets, or other written materials from your doctor or pharmacy?: 1 - Never ? ?Diabetic?  no ? ?Interpreter Needed?: No ? ?Information entered by :: Remi Haggard LPN ? ? ?Activities of Daily Living ? ?  12/14/2021  ? 11:08 AM 11/28/2021  ?  2:28 PM  ?In your present state of health, do you  have any difficulty performing the following activities:  ?Hearing? 0 0  ?Vision? 0 0  ?Difficulty concentrating or making decisions? 0 0  ?Walking or climbing stairs? 0 0  ?Dressing or bathing? 0 0  ?Doing errands, shopping? 0 0  ?Preparing Food and eating ? N   ?Using the Toilet? N   ?In the past six months, have you accidently leaked urine? N   ?Do you have problems with loss of bowel control? N   ?Managing your Medications? N   ? ? ?Patient Care Team: ?Marjie Skiff, NP as PCP - General (Nurse Practitioner) ? ?Indicate any recent Medical Services you may have received from other than [facility] providers in the past year (date may be approximate). ? ?   ?Assessment:  ? This is a routine wellness examination for Joyce Estes. ? ?Hearing/Vision screen ?Hearing Screening - Comments:: No trouble hearing ?Vision Screening - Comments:: Up to date ?[place] ? ?Dietary issues and exercise activities discussed: ?Current Exercise Habits: Home exercise routine, Type of exercise: walking;stretching, Time (Minutes): 30, Frequency (Times/Week): 4, Weekly Exercise (Minutes/Week): 120, Intensity: Mild ? ? Goals Addressed   ? ?  ?  ?  ?  ? This Visit's Progress  ?  Increase physical activity     ? ?  ? ?Depression Screen ? ?  12/14/2021  ? 11:10 AM 11/28/2021  ?  2:08 PM 12/06/2020  ?  2:47 PM 12/03/2019  ?  2:22 PM 11/12/2019  ?  2:14 PM 06/17/2018  ?  2:09 PM 06/17/2018  ?  1:35 PM  ?PHQ 2/9 Scores  ?PHQ - 2 Score 1 2 0 0 0 0 0  ?PHQ- 9 Score 3 4   1 1 1   ?  ?Fall Risk ? ?  12/14/2021  ? 11:07 AM 11/28/2021  ?  2:27 PM 12/06/2020  ?  2:47 PM 12/03/2019  ?  2:20 PM 06/17/2018  ?  2:09 PM  ?Fall Risk   ?Falls in the past year? 0 0 0 0 No  ?Number falls in past yr: 0 0  0   ?Injury with Fall? 0 0  0   ?Risk for fall due to :  No Fall Risks No Fall Risks    ?Follow up Falls evaluation completed;Education provided;Falls prevention discussed Falls evaluation completed Falls evaluation completed;Education provided;Falls prevention discussed    ? ? ?FALL  RISK PREVENTION PERTAINING TO THE HOME: ? ?Any stairs in or around the home? No  ?If so, are there any without handrails? No  ?Home free of loose throw rugs in walkways, pet beds, electrical cords, etc? Yes  ?Adequate lighting in your home to reduce risk of falls? Yes  ? ?ASSISTIVE DEVICES UTILIZED TO PREVENT FALLS: ? ?Life alert? No  ?Use of a cane, walker or w/c? No  ?Grab bars in  the bathroom? Yes  ?Shower chair or bench in shower? Yes  ?Elevated toilet seat or a handicapped toilet? No  ? ?TIMED UP AND GO: ? ?Was the test performed? No .  ? ? ?Cognitive Function: ?  ?  ? ?  12/14/2021  ? 11:06 AM 12/06/2020  ?  2:49 PM 12/03/2019  ?  2:24 PM  ?6CIT Screen  ?What Year? 0 points 0 points 0 points  ?What month? 0 points 0 points 0 points  ?What time? 0 points 0 points 0 points  ?Count back from 20 0 points 0 points 0 points  ?Months in reverse 0 points 0 points 0 points  ?Repeat phrase 0 points 0 points 0 points  ?Total Score 0 points 0 points 0 points  ? ? ?Immunizations ?Immunization History  ?Administered Date(s) Administered  ? Fluad Quad(high Dose 65+) 05/18/2020  ? Influenza, High Dose Seasonal PF 06/17/2018  ? Moderna Sars-Covid-2 Vaccination 10/01/2019, 10/29/2019  ? Pneumococcal Conjugate-13 06/17/2018  ? Pneumococcal Polysaccharide-23 12/03/2019  ? Td 11/28/2021  ? ? ?TDAP status: Up to date ? ?Flu Vaccine status: Due, Education has been provided regarding the importance of this vaccine. Advised may receive this vaccine at local pharmacy or Health Dept. Aware to provide a copy of the vaccination record if obtained from local pharmacy or Health Dept. Verbalized acceptance and understanding. ? ?Pneumococcal vaccine status: Up to date ? ?Covid-19 vaccine status: Declined, Education has been provided regarding the importance of this vaccine but patient still declined. Advised may receive this vaccine at local pharmacy or Health Dept.or vaccine clinic. Aware to provide a copy of the vaccination record if obtained  from local pharmacy or Health Dept. Verbalized acceptance and understanding. ? ?Qualifies for Shingles Vaccine? Yes   ?Zostavax completed No   ?Shingrix Completed?: No.    Education has been provided rega

## 2021-12-28 DIAGNOSIS — H2513 Age-related nuclear cataract, bilateral: Secondary | ICD-10-CM | POA: Diagnosis not present

## 2021-12-28 DIAGNOSIS — H02035 Senile entropion of left lower eyelid: Secondary | ICD-10-CM | POA: Diagnosis not present

## 2021-12-28 DIAGNOSIS — H02032 Senile entropion of right lower eyelid: Secondary | ICD-10-CM | POA: Diagnosis not present

## 2021-12-28 DIAGNOSIS — H02039 Senile entropion of unspecified eye, unspecified eyelid: Secondary | ICD-10-CM | POA: Diagnosis not present

## 2021-12-28 DIAGNOSIS — H25043 Posterior subcapsular polar age-related cataract, bilateral: Secondary | ICD-10-CM | POA: Diagnosis not present

## 2022-01-12 ENCOUNTER — Other Ambulatory Visit: Payer: Medicare Other

## 2022-01-18 ENCOUNTER — Other Ambulatory Visit: Payer: Medicare Other

## 2022-01-18 DIAGNOSIS — R7989 Other specified abnormal findings of blood chemistry: Secondary | ICD-10-CM

## 2022-01-19 LAB — T4, FREE: Free T4: 1.24 ng/dL (ref 0.82–1.77)

## 2022-01-19 LAB — TSH: TSH: 0.944 u[IU]/mL (ref 0.450–4.500)

## 2022-01-19 NOTE — Progress Notes (Signed)
Contacted via MyChart   Good morning Joyce Estes, your labs have returned and thyroid labs are normal this check.  Great news!!  No changes needed. Keep being amazing!!  Thank you for allowing me to participate in your care.  I appreciate you. Kindest regards, Jamarii Banks

## 2022-04-20 ENCOUNTER — Encounter: Payer: Self-pay | Admitting: Nurse Practitioner

## 2022-04-27 ENCOUNTER — Telehealth: Payer: Self-pay

## 2022-04-27 NOTE — Telephone Encounter (Signed)
Hello Joyce Estes, I am going through our reports for the office and see that you are overdue for your mammogram. I would be glad to  schedule the mammogram for you that was ordered by Jolene on 11/28/21 if you would like. We typically use [facility] for this. Would you be ok with this? If so please return our call with what days and times of the day that works best for you. Please let me know either way which you would like to do. I hope you have a great day?

## 2022-05-30 ENCOUNTER — Telehealth: Payer: Medicare Other | Admitting: Nurse Practitioner

## 2022-11-21 ENCOUNTER — Telehealth: Payer: Self-pay | Admitting: Nurse Practitioner

## 2022-11-21 NOTE — Telephone Encounter (Signed)
Copied from [place] [id number]. Topic: Medicare AWV >> Nov 21, 2022  2:16 PM Devoria Glassing wrote: Reason for CRM: Called patient to schedule Medicare Annual Wellness Visit (AWV). Left message for patient to call back and schedule Medicare Annual Wellness Visit (AWV).  Last date of AWV: 12/14/21  Please schedule an appointment at any time with Kirke Shaggy, LPN  .  If any questions, please contact me.  Thank you ,  Sherol Dade; [place] Direct Dial: [phone]

## 2023-01-04 ENCOUNTER — Telehealth: Payer: Self-pay | Admitting: Nurse Practitioner

## 2023-01-04 NOTE — Telephone Encounter (Signed)
Contacted Paidyn J Vitrano to schedule their annual wellness visit. Appointment made for 01/09/2023.  Verlee Rossetti; Care Guide Ambulatory Clinical Support [place] [facility] Direct Dial: [phone]

## 2023-02-02 ENCOUNTER — Telehealth: Payer: Self-pay | Admitting: Nurse Practitioner

## 2023-02-02 NOTE — Telephone Encounter (Signed)
Copied from CRM [id number]. Topic: Medicare AWV >> Feb 02, 2023 11:34 AM Payton Doughty wrote: Reason for CRM: LM 02/02/2023 to schedule AWV   Verlee Rossetti; Care Guide Ambulatory Clinical Support [place] [facility] Direct Dial: [phone]

## 2023-02-22 ENCOUNTER — Telehealth: Payer: Self-pay | Admitting: Nurse Practitioner

## 2023-02-22 NOTE — Telephone Encounter (Signed)
Copied from CRM [id number]. Topic: Medicare AWV >> Feb 22, 2023 10:34 AM Payton Doughty wrote: Reason for CRM: LM 02/22/2023 to schedule AWV   Verlee Rossetti; Care Guide Ambulatory Clinical Support [place] [facility] Direct Dial: [phone]

## 2023-06-21 ENCOUNTER — Ambulatory Visit (INDEPENDENT_AMBULATORY_CARE_PROVIDER_SITE_OTHER): Payer: Medicare Other

## 2023-06-21 ENCOUNTER — Encounter: Payer: Medicare Other | Admitting: Nurse Practitioner

## 2023-06-21 DIAGNOSIS — I700 Atherosclerosis of aorta: Secondary | ICD-10-CM

## 2023-06-21 DIAGNOSIS — Z23 Encounter for immunization: Secondary | ICD-10-CM | POA: Diagnosis not present

## 2023-06-21 DIAGNOSIS — E785 Hyperlipidemia, unspecified: Secondary | ICD-10-CM

## 2023-06-21 DIAGNOSIS — Z1211 Encounter for screening for malignant neoplasm of colon: Secondary | ICD-10-CM

## 2023-06-21 DIAGNOSIS — Z780 Asymptomatic menopausal state: Secondary | ICD-10-CM

## 2023-06-21 DIAGNOSIS — Z0000 Encounter for general adult medical examination without abnormal findings: Secondary | ICD-10-CM

## 2023-06-21 DIAGNOSIS — E559 Vitamin D deficiency, unspecified: Secondary | ICD-10-CM

## 2023-06-21 DIAGNOSIS — Z1231 Encounter for screening mammogram for malignant neoplasm of breast: Secondary | ICD-10-CM

## 2023-06-30 NOTE — Patient Instructions (Incomplete)
Please call to schedule your mammogram and/or bone density: [facility] at [facility]  Address: [address], [place], [place] [address] Phone: [phone]  [facility] [address] [place],  [place]  [address] Phone: [phone]   Healthy Eating, Adult Healthy eating may help you get and keep a healthy body weight, reduce the risk of chronic disease, and live a long and productive life. It is important to follow a healthy eating pattern. Your nutritional and calorie needs should be met mainly by different nutrient-rich foods. What are tips for following this plan? Reading food labels Read labels and choose the following: Reduced or low sodium products. Juices with 100% fruit juice. Foods with low saturated fats (<3 g per serving) and high polyunsaturated and monounsaturated fats. Foods with whole grains, such as whole wheat, cracked wheat, brown rice, and wild rice. Whole grains that are fortified with folic acid. This is recommended for females who are pregnant or who want to become pregnant. Read labels and do not eat or drink the following: Foods or drinks with added sugars. These include foods that contain brown sugar, corn sweetener, corn syrup, dextrose, fructose, glucose, high-fructose corn syrup, honey, invert sugar, lactose, malt syrup, maltose, molasses, raw sugar, sucrose, trehalose, or turbinado sugar. Limit your intake of added sugars to less than 10% of your total daily calories. Do not eat more than the following amounts of added sugar per day: 6 teaspoons (25 g) for females. 9 teaspoons (38 g) for males. Foods that contain processed or refined starches and grains. Refined grain products, such as white flour, degermed cornmeal, white bread, and white rice. Shopping Choose nutrient-rich snacks, such as vegetables, whole fruits, and nuts. Avoid high-calorie and high-sugar snacks, such as potato chips,  fruit snacks, and candy. Use oil-based dressings and spreads on foods instead of solid fats such as butter, margarine, sour cream, or cream cheese. Limit pre-made sauces, mixes, and "instant" products such as flavored rice, instant noodles, and ready-made pasta. Try more plant-protein sources, such as tofu, tempeh, black beans, edamame, lentils, nuts, and seeds. Explore eating plans such as the Mediterranean diet or vegetarian diet. Try heart-healthy dips made with beans and healthy fats like hummus and guacamole. Vegetables go great with these. Cooking Use oil to saut or stir-fry foods instead of solid fats such as butter, margarine, or lard. Try baking, boiling, grilling, or broiling instead of frying. Remove the fatty part of meats before cooking. Steam vegetables in water or broth. Meal planning  At meals, imagine dividing your plate into fourths: One-half of your plate is fruits and vegetables. One-fourth of your plate is whole grains. One-fourth of your plate is protein, especially lean meats, poultry, eggs, tofu, beans, or nuts. Include low-fat dairy as part of your daily diet. Lifestyle Choose healthy options in all settings, including home, work, school, restaurants, or stores. Prepare your food safely: Wash your hands after handling raw meats. Where you prepare food, keep surfaces clean by regularly washing with hot, soapy water. Keep raw meats separate from ready-to-eat foods, such as fruits and vegetables. Cook seafood, meat, poultry, and eggs to the recommended temperature. Get a food thermometer. Store foods at safe temperatures. In general: Keep cold foods at 22F (4.4C) or below. Keep hot foods at 122F (60C) or above. Keep your freezer at [facility] (-17.8C) or below. Foods are not safe to eat if they have been between the temperatures of 40-122F (4.4-60C) for more than  2 hours. What foods should I eat? Fruits Aim to eat 1-2 cups of fresh, canned (in natural juice),  or frozen fruits each day. One cup of fruit equals 1 small apple, 1 large banana, 8 large strawberries, 1 cup (237 g) canned fruit,  cup (82 g) dried fruit, or 1 cup (240 mL) 100% juice. Vegetables Aim to eat 2-4 cups of fresh and frozen vegetables each day, including different varieties and colors. One cup of vegetables equals 1 cup (91 g) broccoli or cauliflower florets, 2 medium carrots, 2 cups (150 g) raw, leafy greens, 1 large tomato, 1 large bell pepper, 1 large sweet potato, or 1 medium white potato. Grains Aim to eat 5-10 ounce-equivalents of whole grains each day. Examples of 1 ounce-equivalent of grains include 1 slice of bread, 1 cup (40 g) ready-to-eat cereal, 3 cups (24 g) popcorn, or  cup (93 g) cooked rice. Meats and other proteins Try to eat 5-7 ounce-equivalents of protein each day. Examples of 1 ounce-equivalent of protein include 1 egg,  oz nuts (12 almonds, 24 pistachios, or 7 walnut halves), 1/4 cup (90 g) cooked beans, 6 tablespoons (90 g) hummus or 1 tablespoon (16 g) peanut butter. A cut of meat or fish that is the size of a deck of cards is about 3-4 ounce-equivalents (85 g). Of the protein you eat each week, try to have at least 8 sounce (227 g) of seafood. This is about 2 servings per week. This includes salmon, trout, herring, sardines, and anchovies. Dairy Aim to eat 3 cup-equivalents of fat-free or low-fat dairy each day. Examples of 1 cup-equivalent of dairy include 1 cup (240 mL) milk, 8 ounces (250 g) yogurt, 1 ounces (44 g) natural cheese, or 1 cup (240 mL) fortified soy milk. Fats and oils Aim for about 5 teaspoons (21 g) of fats and oils per day. Choose monounsaturated fats, such as canola and olive oils, mayonnaise made with olive oil or avocado oil, avocados, peanut butter, and most nuts, or polyunsaturated fats, such as sunflower, corn, and soybean oils, walnuts, pine nuts, sesame seeds, sunflower seeds, and flaxseed. Beverages Aim for 6 eight-ounce glasses of  water per day. Limit coffee to 3-5 eight-ounce cups per day. Limit caffeinated beverages that have added calories, such as soda and energy drinks. If you drink alcohol: Limit how much you have to: 0-1 drink a day if you are female. 0-2 drinks a day if you are female. Know how much alcohol is in your drink. In the [place] one drink is one 12 oz bottle of beer (355 mL), one 5 oz glass of wine (148 mL), or one 1 oz glass of hard liquor (44 mL). Seasoning and other foods Try not to add too much salt to your food. Try using herbs and spices instead of salt. Try not to add sugar to food. This information is based on [place] nutrition guidelines. To learn more, visit DisposableNylon.be. Exact amounts may vary. You may need different amounts. This information is not intended to replace advice given to you by your health care provider. Make sure you discuss any questions you have with your health care provider. Document Revised: 05/15/2022 Document Reviewed: 05/15/2022 Elsevier Patient Education  2024 [employer].

## 2023-07-02 ENCOUNTER — Ambulatory Visit (INDEPENDENT_AMBULATORY_CARE_PROVIDER_SITE_OTHER): Payer: Medicare Other | Admitting: Nurse Practitioner

## 2023-07-02 ENCOUNTER — Encounter: Payer: Self-pay | Admitting: Nurse Practitioner

## 2023-07-02 VITALS — BP 104/70 | HR 80 | Temp 97.6°F | Ht 60.0 in | Wt 114.2 lb

## 2023-07-02 DIAGNOSIS — Z0000 Encounter for general adult medical examination without abnormal findings: Secondary | ICD-10-CM

## 2023-07-02 DIAGNOSIS — Z780 Asymptomatic menopausal state: Secondary | ICD-10-CM | POA: Diagnosis not present

## 2023-07-02 DIAGNOSIS — Z1211 Encounter for screening for malignant neoplasm of colon: Secondary | ICD-10-CM

## 2023-07-02 DIAGNOSIS — Z5982 Transportation insecurity: Secondary | ICD-10-CM | POA: Diagnosis not present

## 2023-07-02 DIAGNOSIS — E559 Vitamin D deficiency, unspecified: Secondary | ICD-10-CM | POA: Diagnosis not present

## 2023-07-02 DIAGNOSIS — Z1231 Encounter for screening mammogram for malignant neoplasm of breast: Secondary | ICD-10-CM

## 2023-07-02 DIAGNOSIS — E785 Hyperlipidemia, unspecified: Secondary | ICD-10-CM

## 2023-07-02 DIAGNOSIS — I700 Atherosclerosis of aorta: Secondary | ICD-10-CM | POA: Diagnosis not present

## 2023-07-02 NOTE — Assessment & Plan Note (Signed)
Noted on CT 04/20/2017, would benefit from statin therapy, but wishes to hold off.  Check lipid panel today and if elevations continue to recommend statin. ?

## 2023-07-02 NOTE — Progress Notes (Signed)
BP 104/70 (BP Location: Left Arm, Patient Position: Sitting, Cuff Size: Normal)   Pulse 80   Temp 97.6 F (36.4 C) (Oral)   Ht 5' (1.524 m)   Wt 114 lb 3.2 oz (51.8 kg)   SpO2 97%   BMI 22.30 kg/m    Subjective:    Patient ID: Joyce Estes, female    DOB: [birth year], 72 y.o.   MRN: [id number]  HPI: Joyce Estes is a 72 y.o. female presenting on 07/02/2023 for comprehensive medical examination. Current medical complaints include:none  She currently lives with: brother Menopausal Symptoms: no   HYPERLIPIDEMIA She is fasting this morning, no current medications.  Vitamin D at home, continues on daily supplement.  Bone density and mammogram ordered in past, has not attended.    History of aortic atherosclerosis noted on past imaging. Hyperlipidemia status: good compliance Satisfied with current treatment?  yes Side effects:  no Medication compliance: good compliance Past cholesterol meds: none Supplements: none Aspirin:  no The 10-year ASCVD risk score (Arnett DK, et al., 2019) is: 9.7%   Values used to calculate the score:     Age: 89 years     Sex: Female     Is Non-Hispanic African American: No     Diabetic: No     Tobacco smoker: No     Systolic Blood Pressure: 104 mmHg     Is BP treated: No     HDL Cholesterol: 65 mg/dL     Total Cholesterol: 180 mg/dL Chest pain:  no Coronary artery disease:  no Family history CAD:  no Family history early CAD:  no   Depression Screen done today and results listed below:     07/02/2023   10:25 AM 12/14/2021   11:10 AM 11/28/2021    2:08 PM 12/06/2020    2:47 PM 12/03/2019    2:22 PM  Depression screen PHQ 2/9  Decreased Interest 0 0  0 0  Down, Depressed, Hopeless 0 1 2 0 0  PHQ - 2 Score 0 1 2 0 0  Altered sleeping 2 2 2     Tired, decreased energy 0 0 0    Change in appetite 0 0 0    Feeling bad or failure about yourself  1 0 0    Trouble concentrating 1 0 0    Moving slowly or fidgety/restless 0 0 0    Suicidal  thoughts 0 0 0    PHQ-9 Score 4 3 4         07/02/2023   10:25 AM 11/28/2021    2:10 PM 06/17/2018    1:34 PM  GAD 7 : Generalized Anxiety Score  Nervous, Anxious, on Edge 1 1 0  Control/stop worrying 0 0 1  Worry too much - different things 1  1  Trouble relaxing 1 1 1   Restless 0 1 0  Easily annoyed or irritable 1 1 1   Afraid - awful might happen 0 0 0  Total GAD 7 Score 4  4  Anxiety Difficulty  Somewhat difficult       12/03/2019    2:20 PM 12/06/2020    2:47 PM 11/28/2021    2:27 PM 12/14/2021   11:07 AM 07/02/2023   10:41 AM  Fall Risk  Falls in the past year? 0 0 0 0 0  Was there an injury with Fall? 0  0 0 0  Fall Risk Category Calculator 0  0 0 0  Fall Risk Category (Retired) Low  Low Low   (RETIRED) Patient Fall Risk Level Low fall risk Low fall risk Low fall risk Low fall risk   Patient at Risk for Falls Due to  No Fall Risks No Fall Risks  No Fall Risks  Fall risk Follow up  Falls evaluation completed;Education provided;Falls prevention discussed Falls evaluation completed Falls evaluation completed;Education provided;Falls prevention discussed Falls prevention discussed    Functional Status Survey: Is the patient deaf or have difficulty hearing?: No Does the patient have difficulty seeing, even when wearing glasses/contacts?: No Does the patient have difficulty concentrating, remembering, or making decisions?: No Does the patient have difficulty walking or climbing stairs?: No Does the patient have difficulty dressing or bathing?: No Does the patient have difficulty doing errands alone such as visiting a doctor's office or shopping?: No   Past Medical History:  History reviewed. No pertinent past medical history.  Surgical History:  Past Surgical History:  Procedure Laterality Date   FOOT SURGERY     Medications:  Current Outpatient Medications on File Prior to Visit  Medication Sig   Acetaminophen (TYLENOL ARTHRITIS PAIN PO) Take by mouth every 6 (six) hours.    cholecalciferol (VITAMIN D3) 25 MCG (1000 UT) tablet Take 1,000 Units by mouth daily.   Multiple Vitamin (MULTIVITAMIN PO) Take by mouth daily.   No current facility-administered medications on file prior to visit.    Allergies:  No Known Allergies  Social History:  Social History   Socioeconomic History   Marital status: Single    Spouse name: Not on file   Number of children: Not on file   Years of education: Not on file   Highest education level: Not on file  Occupational History   Not on file  Tobacco Use   Smoking status: Never   Smokeless tobacco: Never  Vaping Use   Vaping status: Never Used  Substance and Sexual Activity   Alcohol use: Not Currently   Drug use: Never   Sexual activity: Not Currently  Other Topics Concern   Not on file  Social History Narrative   Not on file   Social Determinants of Health   Financial Resource Strain: Low Risk  (07/02/2023)   Overall Financial Resource Strain (CARDIA)    Difficulty of Paying Living Expenses: Not hard at all  Food Insecurity: No Food Insecurity (07/02/2023)   Hunger Vital Sign    Worried About Running Out of Food in the Last Year: Never true    Ran Out of Food in the Last Year: Never true  Transportation Needs: Unmet Transportation Needs (07/02/2023)   PRAPARE - Transportation    Lack of Transportation (Medical): Yes    Lack of Transportation (Non-Medical): Yes  Physical Activity: Insufficiently Active (07/02/2023)   Exercise Vital Sign    Days of Exercise per Week: 3 days    Minutes of Exercise per Session: 40 min  Stress: No Stress Concern Present (07/02/2023)   [employer] of Occupational Health - Occupational Stress Questionnaire    Feeling of Stress : Only a little  Social Connections: Socially Isolated (07/02/2023)   Social Connection and Isolation Panel [NHANES]    Frequency of Communication with Friends and Family: Three times a week    Frequency of Social Gatherings with Friends and Family:  Three times a week    Attends Religious Services: Never    Active Member of Clubs or Organizations: No    Attends Banker Meetings: Never    Marital Status: Never married  Intimate  Partner Violence: Not At Risk (07/02/2023)   Humiliation, Afraid, Rape, and Kick questionnaire    Fear of Current or Ex-Partner: No    Emotionally Abused: No    Physically Abused: No    Sexually Abused: No   Social History   Tobacco Use  Smoking Status Never  Smokeless Tobacco Never   Social History   Substance and Sexual Activity  Alcohol Use Not Currently    Family History:  Family History  Problem Relation Age of Onset   Heart disease Mother    Diabetes Father    Diabetes Paternal Grandmother     Past medical history, surgical history, medications, allergies, family history and social history reviewed with patient today and changes made to appropriate areas of the chart.   Review of Systems - negative All other ROS negative except what is listed above and in the HPI.      Objective:    BP 104/70 (BP Location: Left Arm, Patient Position: Sitting, Cuff Size: Normal)   Pulse 80   Temp 97.6 F (36.4 C) (Oral)   Ht 5' (1.524 m)   Wt 114 lb 3.2 oz (51.8 kg)   SpO2 97%   BMI 22.30 kg/m   Wt Readings from Last 3 Encounters:  07/02/23 114 lb 3.2 oz (51.8 kg)  11/28/21 115 lb 9.6 oz (52.4 kg)  12/06/20 125 lb (56.7 kg)    Physical Exam Vitals and nursing note reviewed. Exam conducted with a chaperone present.  Constitutional:      General: She is awake. She is not in acute distress.    Appearance: She is well-developed. She is not ill-appearing.  HENT:     Head: Normocephalic and atraumatic.     Right Ear: Hearing, tympanic membrane, ear canal and external ear normal. No drainage.     Left Ear: Hearing, tympanic membrane, ear canal and external ear normal. No drainage.     Nose: Nose normal.     Right Sinus: No maxillary sinus tenderness or frontal sinus tenderness.      Left Sinus: No maxillary sinus tenderness or frontal sinus tenderness.     Mouth/Throat:     Mouth: Mucous membranes are moist.     Pharynx: Oropharynx is clear. Uvula midline. No pharyngeal swelling, oropharyngeal exudate or posterior oropharyngeal erythema.  Eyes:     General: Lids are normal.        Right eye: No discharge.        Left eye: No discharge.     Extraocular Movements: Extraocular movements intact.     Conjunctiva/sclera: Conjunctivae normal.     Pupils: Pupils are equal, round, and reactive to light.     Visual Fields: Right eye visual fields normal and left eye visual fields normal.  Neck:     Thyroid: No thyromegaly.     Vascular: No carotid bruit.     Trachea: Trachea normal.  Cardiovascular:     Rate and Rhythm: Normal rate and regular rhythm.     Heart sounds: Normal heart sounds. No murmur heard.    No gallop.  Pulmonary:     Effort: Pulmonary effort is normal. No accessory muscle usage or respiratory distress.     Breath sounds: Normal breath sounds.  Chest:  Breasts:    Right: Normal.     Left: Normal.  Abdominal:     General: Bowel sounds are normal.     Palpations: Abdomen is soft. There is no hepatomegaly or splenomegaly.     Tenderness:  There is no abdominal tenderness.  Musculoskeletal:        General: Normal range of motion.     Cervical back: Normal range of motion and neck supple.     Right lower leg: No edema.     Left lower leg: No edema.  Lymphadenopathy:     Head:     Right side of head: No submental, submandibular, tonsillar, preauricular or posterior auricular adenopathy.     Left side of head: No submental, submandibular, tonsillar, preauricular or posterior auricular adenopathy.     Cervical: No cervical adenopathy.     Upper Body:     Right upper body: No supraclavicular, axillary or pectoral adenopathy.     Left upper body: No supraclavicular, axillary or pectoral adenopathy.  Skin:    General: Skin is warm and dry.     Capillary  Refill: Capillary refill takes less than 2 seconds.     Findings: No rash.  Neurological:     Mental Status: She is alert and oriented to person, place, and time.     Gait: Gait is intact.     Deep Tendon Reflexes: Reflexes are normal and symmetric.     Reflex Scores:      Brachioradialis reflexes are 2+ on the right side and 2+ on the left side.      Patellar reflexes are 2+ on the right side and 2+ on the left side. Psychiatric:        Attention and Perception: Attention normal.        Mood and Affect: Mood normal.        Speech: Speech normal.        Behavior: Behavior normal. Behavior is cooperative.        Thought Content: Thought content normal.        Judgment: Judgment normal.       07/02/2023   10:50 AM 12/14/2021   11:06 AM 12/06/2020    2:49 PM 12/03/2019    2:24 PM  6CIT Screen  What Year? 0 points 0 points 0 points 0 points  What month? 0 points 0 points 0 points 0 points  What time? 0 points 0 points 0 points 0 points  Count back from 20 0 points 0 points 0 points 0 points  Months in reverse 0 points 0 points 0 points 0 points  Repeat phrase 0 points 0 points 0 points 0 points  Total Score 0 points 0 points 0 points 0 points   Results for orders placed or performed in visit on 04/20/22  Cologuard  Result Value Ref Range   Cologuard Negative Negative      Assessment & Plan:   Problem List Items Addressed This Visit       Cardiovascular and Mediastinum   Aortic atherosclerosis (HCC)    Noted on CT 04/20/2017, would benefit from statin therapy, but wishes to hold off.  Check lipid panel today and if elevations continue to recommend statin.      Relevant Orders   CBC with Differential/Platelet     Other   Hyperlipidemia LDL goal <100    Noted on past labs. ASCVD 9.7%.  Continue diet focus and recheck lipid panel today.  She is fasting.  Recommend Omega 3 or Fish Oil at home, as she prefers not to take medication.      Relevant Orders   Comprehensive  metabolic panel   Lipid Panel w/o Chol/HDL Ratio   Vitamin D deficiency    Ongoing, taking  supplement.  Have recommended scheduling DEXA scan to further assess bone health.  Continue supplement.        Relevant Orders   VITAMIN D 25 Hydroxy (Vit-D Deficiency, Fractures)   Other Visit Diagnoses     Medicare annual wellness visit, subsequent    -  Primary   Medicare wellness due and performed with patient today.   Transportation insecurity       Referral to SW to get to mammogram and DEXA   Relevant Orders   AMB Referral [facility] Care Management   Colon cancer screening       Cologuard ordered today.   Relevant Orders   Cologuard   Encounter for screening mammogram for malignant neoplasm of breast       Mammogram scan ordered and instructed on how to schedule.   Relevant Orders   MM 3D SCREENING MAMMOGRAM BILATERAL BREAST   Postmenopausal estrogen deficiency       DEXA scan ordered and instructed on how to schedule.   Relevant Orders   DG Bone Density   Encounter for annual physical exam       Annual physical today with labs and health maintenance reviewed, discussed with patient.   Relevant Orders   TSH        Follow up plan: Return in about 1 year (around 07/01/2024) for Annual Physical.   LABORATORY TESTING:  - Pap smear: not applicable  IMMUNIZATIONS:   - Tdap: Tetanus vaccination status reviewed: Up To Date - Influenza: Up To Date - Pneumovax: Up To Date - Prevnar: Up To Date - HPV: Not applicable - Zostavax vaccine: Refused  SCREENING: -Mammogram: Ordered today  - Colonoscopy: Cologuard ordered - Bone Density: Ordered today  -Hearing Test: Not applicable  -Spirometry: Not applicable   PATIENT COUNSELING:   Advised to take 1 mg of folate supplement per day if capable of pregnancy.   Sexuality: Discussed sexually transmitted diseases, partner selection, use of condoms, avoidance of unintended pregnancy  and contraceptive alternatives.   Advised to avoid  cigarette smoking.  I discussed with the patient that most people either abstain from alcohol or drink within safe limits (<=14/week and <=4 drinks/occasion for males, <=7/weeks and <= 3 drinks/occasion for females) and that the risk for alcohol disorders and other health effects rises proportionally with the number of drinks per week and how often a drinker exceeds daily limits.  Discussed cessation/primary prevention of drug use and availability of treatment for abuse.   Diet: Encouraged to adjust caloric intake to maintain  or achieve ideal body weight, to reduce intake of dietary saturated fat and total fat, to limit sodium intake by avoiding high sodium foods and not adding table salt, and to maintain adequate dietary potassium and calcium preferably from fresh fruits, vegetables, and low-fat dairy products.    Stressed the importance of regular exercise  Injury prevention: Discussed safety belts, safety helmets, smoke detector, smoking near bedding or upholstery.   Dental health: Discussed importance of regular tooth brushing, flossing, and dental visits.    NEXT PREVENTATIVE PHYSICAL DUE IN 1 YEAR. Return in about 1 year (around 07/01/2024) for Annual Physical.

## 2023-07-02 NOTE — Assessment & Plan Note (Signed)
Noted on past labs. ASCVD 9.7%.  Continue diet focus and recheck lipid panel today.  She is fasting.  Recommend Omega 3 or Fish Oil at home, as she prefers not to take medication.

## 2023-07-02 NOTE — Assessment & Plan Note (Signed)
Ongoing, taking supplement.  Have recommended scheduling DEXA scan to further assess bone health.  Continue supplement.

## 2023-07-02 NOTE — Addendum Note (Signed)
Addended by: Aura Dials T on: 07/02/2023 02:52 PM   Modules accepted: Level of Service

## 2023-07-03 ENCOUNTER — Telehealth: Payer: Self-pay | Admitting: *Deleted

## 2023-07-03 LAB — LIPID PANEL W/O CHOL/HDL RATIO
Cholesterol, Total: 190 mg/dL (ref 100–199)
HDL: 71 mg/dL (ref >39)
LDL Chol Calc (NIH): 105 mg/dL — ABNORMAL HIGH (ref 0–99)
Triglycerides: 76 mg/dL (ref 0–149)
VLDL Cholesterol Cal: 14 mg/dL (ref 5–40)

## 2023-07-03 LAB — CBC WITH DIFFERENTIAL/PLATELET
Basophils Absolute: 0 10*3/uL (ref 0.0–0.2)
Basos: 1 %
EOS (ABSOLUTE): 0.1 10*3/uL (ref 0.0–0.4)
Eos: 1 %
Hematocrit: 41.7 % (ref 34.0–46.6)
Hemoglobin: 14.1 g/dL (ref 11.1–15.9)
Immature Grans (Abs): 0 10*3/uL (ref 0.0–0.1)
Immature Granulocytes: 0 %
Lymphocytes Absolute: 2 10*3/uL (ref 0.7–3.1)
Lymphs: 35 %
MCH: 32.1 pg (ref 26.6–33.0)
MCHC: 33.8 g/dL (ref 31.5–35.7)
MCV: 95 fL (ref 79–97)
Monocytes Absolute: 0.5 10*3/uL (ref 0.1–0.9)
Monocytes: 9 %
Neutrophils Absolute: 3.1 10*3/uL (ref 1.4–7.0)
Neutrophils: 54 %
Platelets: 346 10*3/uL (ref 150–450)
RBC: 4.39 x10E6/uL (ref 3.77–5.28)
RDW: 12 % (ref 11.7–15.4)
WBC: 5.7 10*3/uL (ref 3.4–10.8)

## 2023-07-03 LAB — COMPREHENSIVE METABOLIC PANEL
ALT: 11 [IU]/L (ref 0–32)
AST: 17 [IU]/L (ref 0–40)
Albumin: 4.1 g/dL (ref 3.8–4.8)
Alkaline Phosphatase: 113 [IU]/L (ref 44–121)
BUN/Creatinine Ratio: 21 (ref 12–28)
BUN: 15 mg/dL (ref 8–27)
Bilirubin Total: 0.7 mg/dL (ref 0.0–1.2)
CO2: 21 mmol/L (ref 20–29)
Calcium: 9.5 mg/dL (ref 8.7–10.3)
Chloride: 104 mmol/L (ref 96–106)
Creatinine, Ser: 0.7 mg/dL (ref 0.57–1.00)
Globulin, Total: 3.2 g/dL (ref 1.5–4.5)
Glucose: 86 mg/dL (ref 70–99)
Potassium: 4.2 mmol/L (ref 3.5–5.2)
Sodium: 138 mmol/L (ref 134–144)
Total Protein: 7.3 g/dL (ref 6.0–8.5)
eGFR: 91 mL/min/{1.73_m2} (ref >59)

## 2023-07-03 LAB — VITAMIN D 25 HYDROXY (VIT D DEFICIENCY, FRACTURES): Vit D, 25-Hydroxy: 43.3 ng/mL (ref 30.0–100.0)

## 2023-07-03 LAB — TSH: TSH: 0.757 u[IU]/mL (ref 0.450–4.500)

## 2023-07-03 NOTE — Telephone Encounter (Signed)
   Telephone encounter was:  Successful.  07/03/2023 Name: Joyce Estes MRN: [id number] DOB: 1951/05/03  Joyce Estes is a 72 y.o. year old female who is a primary care patient of Cannady, Dorie Rank, NP . The community resource team was consulted for assistance with Transportation Needs  Patient provided [place] and [employer] sign up information as this will work in [employer] for her appts there , also  provided information about open enrollment and given the number for Senior resources in [place] for the  [employer] advantage information  Care guide performed the following interventions: Patient provided with information about care guide support team and interviewed to confirm resource needs.  Follow Up Plan:  No further follow up planned at this time. The patient has been provided with needed resources.  Dione Booze [facility]  Population Health Careguide  Direct Dial: [phone] Website: [email or web address]

## 2023-07-03 NOTE — Progress Notes (Signed)
Contacted via MyChart The 10-year ASCVD risk score (Arnett DK, et al., 2019) is: 9.8%   Values used to calculate the score:     Age: 72 years     Sex: Female     Is Non-Hispanic African American: No     Diabetic: No     Tobacco smoker: No     Systolic Blood Pressure: 104 mmHg     Is BP treated: No     HDL Cholesterol: 71 mg/dL     Total Cholesterol: 190 mg/dL   Good morning Joyce Estes, your labs have returned and are overall stable with exception of ongoing elevation of LDL (bad cholesterol) on lipid panel.  Based on your calculated risk score I would recommend we start a small dose of medication to lower the LDL and help prevent stroke or heart attack.  Are you okay with starting this? Let me know.  Any questions? Keep being amazing!!  Thank you for allowing me to participate in your care.  I appreciate you. Kindest regards, Blase Beckner

## 2023-07-09 ENCOUNTER — Telehealth: Payer: Self-pay | Admitting: *Deleted

## 2024-04-22 DIAGNOSIS — Z1211 Encounter for screening for malignant neoplasm of colon: Secondary | ICD-10-CM | POA: Diagnosis not present

## 2024-05-10 ENCOUNTER — Ambulatory Visit: Payer: Self-pay | Admitting: Nurse Practitioner

## 2024-05-10 LAB — COLOGUARD: COLOGUARD: NEGATIVE

## 2024-05-10 NOTE — Progress Notes (Signed)
 Contacted via MyChart  Negative Cologuard, repeat in 3 years:)

## 2024-05-15 ENCOUNTER — Telehealth: Payer: Self-pay

## 2024-05-15 NOTE — Telephone Encounter (Signed)
 Left message to advise patient that she can discard the new kit as her last kit was resulted. Results were left on message.

## 2024-05-15 NOTE — Telephone Encounter (Signed)
 Copied from CRM [id number]. Topic: Clinical - Prescription Issue >> May 15, 2024  3:15 PM DeAngela L wrote: Reason for CRM: patient just returned a Cologaurd kit and the provider left notes on 05/10/24, the patient is calling today cause she just received another kit in the mail and not sure why or what to do with it   Pt num [phone] (M)

## 2024-06-28 NOTE — Patient Instructions (Incomplete)
Please call to schedule your mammogram and/or bone density: [facility] at [facility]  Address: [address], [place], [place] [address] Phone: [phone]  [facility] [address] [place],  [place]  [address] Phone: [phone]   Heart-Healthy Eating Plan Many factors influence your heart health, including eating and exercise habits. Heart health is also called coronary health. Coronary risk increases with abnormal blood fat (lipid) levels. A heart-healthy eating plan includes limiting unhealthy fats, increasing healthy fats, limiting salt (sodium) intake, and making other diet and lifestyle changes. What is my plan? Your health care provider may recommend that: You limit your fat intake to _________% or less of your total calories each day. You limit your saturated fat intake to _________% or less of your total calories each day. You limit the amount of cholesterol in your diet to less than _________ mg per day. You limit the amount of sodium in your diet to less than _________ mg per day. What are tips for following this plan? Cooking Cook foods using methods other than frying. Baking, boiling, grilling, and broiling are all good options. Other ways to reduce fat include: Removing the skin from poultry. Removing all visible fats from meats. Steaming vegetables in water or broth. Meal planning  At meals, imagine dividing your plate into fourths: Fill one-half of your plate with vegetables and green salads. Fill one-fourth of your plate with whole grains. Fill one-fourth of your plate with lean protein foods. Eat 2-4 cups of vegetables per day. One cup of vegetables equals 1 cup (91 g) broccoli or cauliflower florets, 2 medium carrots, 1 large bell pepper, 1 large sweet potato, 1 large tomato, 1 medium white potato, 2 cups (150 g) raw leafy greens. Eat 1-2 cups of fruit per day. One cup of fruit equals 1 small  apple, 1 large banana, 1 cup (237 g) mixed fruit, 1 large orange,  cup (82 g) dried fruit, 1 cup (240 mL) 100% fruit juice. Eat more foods that contain soluble fiber. Examples include apples, broccoli, carrots, beans, peas, and barley. Aim to get 25-30 g of fiber per day. Increase your consumption of legumes, nuts, and seeds to 4-5 servings per week. One serving of dried beans or legumes equals  cup (90 g) cooked, 1 serving of nuts is  oz (12 almonds, 24 pistachios, or 7 walnut halves), and 1 serving of seeds equals  oz (8 g). Fats Choose healthy fats more often. Choose monounsaturated and polyunsaturated fats, such as olive and canola oils, avocado oil, flaxseeds, walnuts, almonds, and seeds. Eat more omega-3 fats. Choose salmon, mackerel, sardines, tuna, flaxseed oil, and ground flaxseeds. Aim to eat fish at least 2 times each week. Check food labels carefully to identify foods with trans fats or high amounts of saturated fat. Limit saturated fats. These are found in animal products, such as meats, butter, and cream. Plant sources of saturated fats include palm oil, palm kernel oil, and coconut oil. Avoid foods with partially hydrogenated oils in them. These contain trans fats. Examples are stick margarine, some tub margarines, cookies, crackers, and other baked goods. Avoid fried foods. General information Eat more home-cooked food and less restaurant, buffet, and fast food. Limit or avoid alcohol. Limit foods that are high in added sugar and simple starches such as foods made using white refined flour (white breads, pastries, sweets). Lose weight if you are overweight. Losing just 5-10% of your body weight can help your  overall health and prevent diseases such as diabetes and heart disease. Monitor your sodium intake, especially if you have high blood pressure. Talk with your health care provider about your sodium intake. Try to incorporate more vegetarian meals weekly. What foods should I  eat? Fruits All fresh, canned (in natural juice), or frozen fruits. Vegetables Fresh or frozen vegetables (raw, steamed, roasted, or grilled). Green salads. Grains Most grains. Choose whole wheat and whole grains most of the time. Rice and pasta, including brown rice and pastas made with whole wheat. Meats and other proteins Lean, well-trimmed beef, veal, pork, and lamb. Chicken and [place] without skin. All fish and shellfish. Wild duck, rabbit, pheasant, and venison. Egg whites or low-cholesterol egg substitutes. Dried beans, peas, lentils, and tofu. Seeds and most nuts. Dairy Low-fat or nonfat cheeses, including ricotta and mozzarella. Skim or 1% milk (liquid, powdered, or evaporated). Buttermilk made with low-fat milk. Nonfat or low-fat yogurt. Fats and oils Non-hydrogenated (trans-free) margarines. Vegetable oils, including soybean, sesame, sunflower, olive, avocado, peanut, safflower, corn, canola, and cottonseed. Salad dressings or mayonnaise made with a vegetable oil. Beverages Water (mineral or sparkling). Coffee and tea. Unsweetened ice tea. Diet beverages. Sweets and desserts Sherbet, gelatin, and fruit ice. Small amounts of dark chocolate. Limit all sweets and desserts. Seasonings and condiments All seasonings and condiments. The items listed above may not be a complete list of foods and beverages you can eat. Contact a dietitian for more options. What foods should I avoid? Fruits Canned fruit in heavy syrup. Fruit in cream or butter sauce. Fried fruit. Limit coconut. Vegetables Vegetables cooked in cheese, cream, or butter sauce. Fried vegetables. Grains Breads made with saturated or trans fats, oils, or whole milk. Croissants. Sweet rolls. Donuts. High-fat crackers, such as cheese crackers and chips. Meats and other proteins Fatty meats, such as hot dogs, ribs, sausage, bacon, rib-eye roast or steak. High-fat deli meats, such as salami and bologna. Caviar. Domestic duck and  goose. Organ meats, such as liver. Dairy Cream, sour cream, cream cheese, and creamed cottage cheese. Whole-milk cheeses. Whole or 2% milk (liquid, evaporated, or condensed). Whole buttermilk. Cream sauce or high-fat cheese sauce. Whole-milk yogurt. Fats and oils Meat fat, or shortening. Cocoa butter, hydrogenated oils, palm oil, coconut oil, palm kernel oil. Solid fats and shortenings, including bacon fat, salt pork, lard, and butter. Nondairy cream substitutes. Salad dressings with cheese or sour cream. Beverages Regular sodas and any drinks with added sugar. Sweets and desserts Frosting. Pudding. Cookies. Cakes. Pies. Milk chocolate or white chocolate. Buttered syrups. Full-fat ice cream or ice cream drinks. The items listed above may not be a complete list of foods and beverages to avoid. Contact a dietitian for more information. Summary Heart-healthy meal planning includes limiting unhealthy fats, increasing healthy fats, limiting salt (sodium) intake and making other diet and lifestyle changes. Lose weight if you are overweight. Losing just 5-10% of your body weight can help your overall health and prevent diseases such as diabetes and heart disease. Focus on eating a balance of foods, including fruits and vegetables, low-fat or nonfat dairy, lean protein, nuts and legumes, whole grains, and heart-healthy oils and fats. This information is not intended to replace advice given to you by your health care provider. Make sure you discuss any questions you have with your health care provider. Document Revised: 09/19/2021 Document Reviewed: 09/19/2021 Elsevier Patient Education  2024 [employer].

## 2024-07-02 ENCOUNTER — Encounter: Payer: Self-pay | Admitting: Nurse Practitioner

## 2024-07-02 ENCOUNTER — Ambulatory Visit: Payer: Self-pay | Admitting: Nurse Practitioner

## 2024-07-02 VITALS — BP 122/77 | HR 78 | Temp 98.1°F | Ht 60.0 in | Wt 119.5 lb

## 2024-07-02 DIAGNOSIS — E559 Vitamin D deficiency, unspecified: Secondary | ICD-10-CM

## 2024-07-02 DIAGNOSIS — Z23 Encounter for immunization: Secondary | ICD-10-CM | POA: Diagnosis not present

## 2024-07-02 DIAGNOSIS — Z0000 Encounter for general adult medical examination without abnormal findings: Secondary | ICD-10-CM

## 2024-07-02 DIAGNOSIS — I700 Atherosclerosis of aorta: Secondary | ICD-10-CM

## 2024-07-02 DIAGNOSIS — Z780 Asymptomatic menopausal state: Secondary | ICD-10-CM | POA: Diagnosis not present

## 2024-07-02 DIAGNOSIS — Z1231 Encounter for screening mammogram for malignant neoplasm of breast: Secondary | ICD-10-CM

## 2024-07-02 DIAGNOSIS — E785 Hyperlipidemia, unspecified: Secondary | ICD-10-CM

## 2024-07-02 NOTE — Assessment & Plan Note (Signed)
 Noted on past labs. Recommend starting statin therapy based on her current ASCVD, educated her on this.  She wishes to hold off and focus on diet/exercise. Educated her on risks of high cholesterol.  Recommend Omega 3 or Fish Oil at home, as she prefers not to take medication. The 10-year ASCVD risk score (Arnett DK, et al., 2019) is: 14.7%   Values used to calculate the score:     Age: 73 years     Clincally relevant sex: Female     Is Non-Hispanic African American: No     Diabetic: No     Tobacco smoker: No     Systolic Blood Pressure: 122 mmHg     Is BP treated: No     HDL Cholesterol: 71 mg/dL     Total Cholesterol: 190 mg/dL

## 2024-07-02 NOTE — Progress Notes (Signed)
 BP 122/77   Pulse 78   Temp 98.1 F (36.7 C) (Oral)   Ht 5' (1.524 m)   Wt 119 lb 8 oz (54.2 kg)   SpO2 98%   BMI 23.34 kg/m    Subjective:    Patient ID: Joyce Estes, female    DOB: [birth year], 73 y.o.   MRN: [id number]  HPI: Joyce Estes is a 73 y.o. female presenting on 07/02/2024 for comprehensive medical examination. Current medical complaints include:none  She currently lives with: brother Menopausal Symptoms: no   HYPERLIPIDEMIA No current statin therapy. Continues to take Vitamin D  daily for low levels. Hyperlipidemia status: no current medications Satisfied with current treatment?  yes Past cholesterol meds: none Supplements: none Aspirin:  no The 10-year ASCVD risk score (Arnett DK, et al., 2019) is: 14.7%   Values used to calculate the score:     Age: 60 years     Clincally relevant sex: Female     Is Non-Hispanic African American: No     Diabetic: No     Tobacco smoker: No     Systolic Blood Pressure: 122 mmHg     Is BP treated: No     HDL Cholesterol: 71 mg/dL     Total Cholesterol: 190 mg/dL Chest pain:  no Coronary artery disease:  no Family history CAD:  no Family history early CAD:  no   Depression Screen done today and results listed below:     07/02/2024    8:27 AM 07/02/2023   10:25 AM 12/14/2021   11:10 AM 11/28/2021    2:08 PM 12/06/2020    2:47 PM  Depression screen PHQ 2/9  Decreased Interest 0 0 0  0  Down, Depressed, Hopeless 0 0 1 2 0  PHQ - 2 Score 0 0 1 2 0  Altered sleeping 0 2 2 2    Tired, decreased energy 0 0 0 0   Change in appetite 1 0 0 0   Feeling bad or failure about yourself  1 1 0 0   Trouble concentrating 1 1 0 0   Moving slowly or fidgety/restless 0 0 0 0   Suicidal thoughts 0 0 0 0   PHQ-9 Score 3 4 3 4    Difficult doing work/chores Not difficult at all          07/02/2024    8:28 AM 07/02/2023   10:25 AM 11/28/2021    2:10 PM 06/17/2018    1:34 PM  GAD 7 : Generalized Anxiety Score  Nervous, Anxious,  on Edge 0 1 1 0  Control/stop worrying 0 0 0 1  Worry too much - different things 1 1  1   Trouble relaxing 0 1 1 1   Restless 0 0 1 0  Easily annoyed or irritable 0 1 1 1   Afraid - awful might happen 0 0 0 0  Total GAD 7 Score 1 4  4   Anxiety Difficulty Not difficult at all  Somewhat difficult       12/03/2019    2:20 PM 12/06/2020    2:47 PM 11/28/2021    2:27 PM 12/14/2021   11:07 AM 07/02/2023   10:41 AM  Fall Risk  Falls in the past year? 0  0 0 0 0  Was there an injury with Fall? 0  0 0 0  Fall Risk Category Calculator 0  0 0 0  Fall Risk Category (Retired) Low   Low  Low    (RETIRED) Patient Fall  Risk Level Low fall risk  Low fall risk  Low fall risk  Low fall risk    Patient at Risk for Falls Due to  No Fall Risks No Fall Risks  No Fall Risks  Fall risk Follow up  Falls evaluation completed;Education provided;Falls prevention discussed  Falls evaluation completed  Falls evaluation completed;Education provided;Falls prevention discussed  Falls prevention discussed     Data saved with a previous flowsheet row definition    Functional Status Survey: Is the patient deaf or have difficulty hearing?: No Does the patient have difficulty seeing, even when wearing glasses/contacts?: No Does the patient have difficulty concentrating, remembering, or making decisions?: No Does the patient have difficulty walking or climbing stairs?: No Does the patient have difficulty dressing or bathing?: No Does the patient have difficulty doing errands alone such as visiting a doctor's office or shopping?: No   Past Medical History:  History reviewed. No pertinent past medical history.  Surgical History:  Past Surgical History:  Procedure Laterality Date   FOOT SURGERY     Medications:  Current Outpatient Medications on File Prior to Visit  Medication Sig   Acetaminophen (TYLENOL ARTHRITIS PAIN PO) Take by mouth every 6 (six) hours.   cholecalciferol (VITAMIN D3) 25 MCG (1000 UT) tablet Take  1,000 Units by mouth daily.   Multiple Vitamin (MULTIVITAMIN PO) Take by mouth daily.   No current facility-administered medications on file prior to visit.    Allergies:  No Known Allergies  Social History:  Social History   Socioeconomic History   Marital status: Single    Spouse name: Not on file   Number of children: Not on file   Years of education: Not on file   Highest education level: Not on file  Occupational History   Not on file  Tobacco Use   Smoking status: Never   Smokeless tobacco: Never  Vaping Use   Vaping status: Never Used  Substance and Sexual Activity   Alcohol use: Not Currently   Drug use: Never   Sexual activity: Not Currently  Other Topics Concern   Not on file  Social History Narrative   Not on file   Social Drivers of Health   Financial Resource Strain: Low Risk  (07/02/2023)   Overall Financial Resource Strain (CARDIA)    Difficulty of Paying Living Expenses: Not hard at all  Food Insecurity: No Food Insecurity (07/02/2023)   Hunger Vital Sign    Worried About Running Out of Food in the Last Year: Never true    Ran Out of Food in the Last Year: Never true  Transportation Needs: Unmet Transportation Needs (07/02/2023)   PRAPARE - Transportation    Lack of Transportation (Medical): Yes    Lack of Transportation (Non-Medical): Yes  Physical Activity: Insufficiently Active (07/02/2023)   Exercise Vital Sign    Days of Exercise per Week: 3 days    Minutes of Exercise per Session: 40 min  Stress: No Stress Concern Present (07/02/2023)   [employer] of Occupational [employer] - Occupational Stress Questionnaire    Feeling of Stress : Only a little  Social Connections: Socially Isolated (07/02/2023)   Social Connection and Isolation Panel    Frequency of Communication with Friends and Family: Three times a week    Frequency of Social Gatherings with Friends and Family: Three times a week    Attends Religious Services: Never    Active Member  of Clubs or Organizations: No    Attends Banker  Meetings: Never    Marital Status: Never married  Intimate Partner Violence: Not At Risk (07/02/2023)   Humiliation, Afraid, Rape, and Kick questionnaire    Fear of Current or Ex-Partner: No    Emotionally Abused: No    Physically Abused: No    Sexually Abused: No   Social History   Tobacco Use  Smoking Status Never  Smokeless Tobacco Never   Social History   Substance and Sexual Activity  Alcohol Use Not Currently    Family History:  Family History  Problem Relation Age of Onset   Heart disease Mother    Diabetes Father    Diabetes Paternal Grandmother     Past medical history, surgical history, medications, allergies, family history and social history reviewed with patient today and changes made to appropriate areas of the chart.   Review of Systems - negative All other ROS negative except what is listed above and in the HPI.      Objective:    BP 122/77   Pulse 78   Temp 98.1 F (36.7 C) (Oral)   Ht 5' (1.524 m)   Wt 119 lb 8 oz (54.2 kg)   SpO2 98%   BMI 23.34 kg/m   Wt Readings from Last 3 Encounters:  07/02/24 119 lb 8 oz (54.2 kg)  07/02/23 114 lb 3.2 oz (51.8 kg)  11/28/21 115 lb 9.6 oz (52.4 kg)    Physical Exam Vitals and nursing note reviewed. Exam conducted with a chaperone present.  Constitutional:      General: She is awake. She is not in acute distress.    Appearance: She is well-developed. She is not ill-appearing.  HENT:     Head: Normocephalic and atraumatic.     Right Ear: Hearing, tympanic membrane, ear canal and external ear normal. No drainage.     Left Ear: Hearing, tympanic membrane, ear canal and external ear normal. No drainage.     Nose: Nose normal.     Right Sinus: No maxillary sinus tenderness or frontal sinus tenderness.     Left Sinus: No maxillary sinus tenderness or frontal sinus tenderness.     Mouth/Throat:     Mouth: Mucous membranes are moist.      Pharynx: Oropharynx is clear. Uvula midline. No pharyngeal swelling, oropharyngeal exudate or posterior oropharyngeal erythema.  Eyes:     General: Lids are normal.        Right eye: No discharge.        Left eye: No discharge.     Extraocular Movements: Extraocular movements intact.     Conjunctiva/sclera: Conjunctivae normal.     Pupils: Pupils are equal, round, and reactive to light.     Visual Fields: Right eye visual fields normal and left eye visual fields normal.  Neck:     Thyroid : No thyromegaly.     Vascular: No carotid bruit.     Trachea: Trachea normal.  Cardiovascular:     Rate and Rhythm: Normal rate and regular rhythm.     Heart sounds: Normal heart sounds. No murmur heard.    No gallop.  Pulmonary:     Effort: Pulmonary effort is normal. No accessory muscle usage or respiratory distress.     Breath sounds: Normal breath sounds.  Chest:  Breasts:    Right: Normal.     Left: Normal.  Abdominal:     General: Bowel sounds are normal.     Palpations: Abdomen is soft. There is no hepatomegaly or splenomegaly.  Tenderness: There is no abdominal tenderness.  Musculoskeletal:        General: Normal range of motion.     Cervical back: Normal range of motion and neck supple.     Thoracic back: Scoliosis present.     Right lower leg: No edema.     Left lower leg: No edema.     Comments: Mild kyphosis.  Lymphadenopathy:     Head:     Right side of head: No submental, submandibular, tonsillar, preauricular or posterior auricular adenopathy.     Left side of head: No submental, submandibular, tonsillar, preauricular or posterior auricular adenopathy.     Cervical: No cervical adenopathy.     Upper Body:     Right upper body: No supraclavicular, axillary or pectoral adenopathy.     Left upper body: No supraclavicular, axillary or pectoral adenopathy.  Skin:    General: Skin is warm and dry.     Capillary Refill: Capillary refill takes less than 2 seconds.      Findings: No rash.  Neurological:     Mental Status: She is alert and oriented to person, place, and time.     Gait: Gait is intact.     Deep Tendon Reflexes: Reflexes are normal and symmetric.     Reflex Scores:      Brachioradialis reflexes are 2+ on the right side and 2+ on the left side.      Patellar reflexes are 2+ on the right side and 2+ on the left side. Psychiatric:        Attention and Perception: Attention normal.        Mood and Affect: Mood normal.        Speech: Speech normal.        Behavior: Behavior normal. Behavior is cooperative.        Thought Content: Thought content normal.        Judgment: Judgment normal.       07/02/2023   10:50 AM 12/14/2021   11:06 AM 12/06/2020    2:49 PM 12/03/2019    2:24 PM  6CIT Screen  What Year? 0 points 0 points 0 points 0 points  What month? 0 points 0 points 0 points 0 points  What time? 0 points 0 points 0 points 0 points  Count back from 20 0 points 0 points 0 points 0 points  Months in reverse 0 points 0 points 0 points 0 points  Repeat phrase 0 points 0 points 0 points 0 points  Total Score 0 points 0 points 0 points 0 points   Results for orders placed or performed in visit on 07/02/23  Comprehensive metabolic panel   Collection Time: 07/02/23 10:27 AM  Result Value Ref Range   Glucose 86 70 - 99 mg/dL   BUN 15 8 - 27 mg/dL   Creatinine, Ser 9.29 0.57 - 1.00 mg/dL   eGFR 91 >40 fO/fpw/8.26   BUN/Creatinine Ratio 21 12 - 28   Sodium 138 134 - 144 mmol/L   Potassium 4.2 3.5 - 5.2 mmol/L   Chloride 104 96 - 106 mmol/L   CO2 21 20 - 29 mmol/L   Calcium 9.5 8.7 - 10.3 mg/dL   Total Protein 7.3 6.0 - 8.5 g/dL   Albumin 4.1 3.8 - 4.8 g/dL   Globulin, Total 3.2 1.5 - 4.5 g/dL   Bilirubin Total 0.7 0.0 - 1.2 mg/dL   Alkaline Phosphatase 113 44 - 121 IU/L   AST 17 0 - 40  IU/L   ALT 11 0 - 32 IU/L  CBC with Differential/Platelet   Collection Time: 07/02/23 10:27 AM  Result Value Ref Range   WBC 5.7 3.4 - 10.8 x10E3/uL    RBC 4.39 3.77 - 5.28 x10E6/uL   Hemoglobin 14.1 11.1 - 15.9 g/dL   Hematocrit 58.2 65.9 - 46.6 %   MCV 95 79 - 97 fL   MCH 32.1 26.6 - 33.0 pg   MCHC 33.8 31.5 - 35.7 g/dL   RDW 87.9 88.2 - 84.5 %   Platelets 346 150 - 450 x10E3/uL   Neutrophils 54 Not Estab. %   Lymphs 35 Not Estab. %   Monocytes 9 Not Estab. %   Eos 1 Not Estab. %   Basos 1 Not Estab. %   Neutrophils Absolute 3.1 1.4 - 7.0 x10E3/uL   Lymphocytes Absolute 2.0 0.7 - 3.1 x10E3/uL   Monocytes Absolute 0.5 0.1 - 0.9 x10E3/uL   EOS (ABSOLUTE) 0.1 0.0 - 0.4 x10E3/uL   Basophils Absolute 0.0 0.0 - 0.2 x10E3/uL   Immature Granulocytes 0 Not Estab. %   Immature Grans (Abs) 0.0 0.0 - 0.1 x10E3/uL  Lipid Panel w/o Chol/HDL Ratio   Collection Time: 07/02/23 10:27 AM  Result Value Ref Range   Cholesterol, Total 190 100 - 199 mg/dL   Triglycerides 76 0 - 149 mg/dL   HDL 71 >60 mg/dL   VLDL Cholesterol Cal 14 5 - 40 mg/dL   LDL Chol Calc (NIH) 894 (H) 0 - 99 mg/dL  TSH   Collection Time: 07/02/23 10:27 AM  Result Value Ref Range   TSH 0.757 0.450 - 4.500 uIU/mL  VITAMIN D  25 Hydroxy (Vit-D Deficiency, Fractures)   Collection Time: 07/02/23 10:27 AM  Result Value Ref Range   Vit D, 25-Hydroxy 43.3 30.0 - 100.0 ng/mL  Cologuard   Collection Time: 04/22/24  4:30 PM  Result Value Ref Range   COLOGUARD Negative Negative      Assessment & Plan:   Problem List Items Addressed This Visit       Cardiovascular and Mediastinum   Aortic atherosclerosis   Noted on CT 04/20/2017, would benefit from statin therapy, but refuses.  Check lipid panel today and if elevations continue to recommend statin.        Other   Vitamin D  deficiency   Ongoing, taking supplement.  Have recommended scheduling DEXA scan to further assess bone health.  Continue supplement.        Relevant Orders   VITAMIN D  25 Hydroxy (Vit-D Deficiency, Fractures)   Hyperlipidemia LDL goal <100 - Primary   Noted on past labs. Recommend starting statin  therapy based on her current ASCVD, educated her on this.  She wishes to hold off and focus on diet/exercise. Educated her on risks of high cholesterol.  Recommend Omega 3 or Fish Oil at home, as she prefers not to take medication. The 10-year ASCVD risk score (Arnett DK, et al., 2019) is: 14.7%   Values used to calculate the score:     Age: 31 years     Clincally relevant sex: Female     Is Non-Hispanic African American: No     Diabetic: No     Tobacco smoker: No     Systolic Blood Pressure: 122 mmHg     Is BP treated: No     HDL Cholesterol: 71 mg/dL     Total Cholesterol: 190 mg/dL       Relevant Orders   Comprehensive metabolic panel with  GFR   Lipid Panel w/o Chol/HDL Ratio   Other Visit Diagnoses       Postmenopausal estrogen deficiency       DEXA ordered.   Relevant Orders   CBC with Differential/Platelet   TSH   DG Bone Density     Encounter for screening mammogram for malignant neoplasm of breast       Mammogram ordered and instructed how to schedule.   Relevant Orders   MM 3D SCREENING MAMMOGRAM BILATERAL BREAST     Flu vaccine need       Flu vaccine today.   Relevant Orders   Flu vaccine HIGH DOSE PF(Fluzone Trivalent)     Encounter for annual physical exam       Annual physical today with labs and health maintenance reviewed, discussed with patient.        Follow up plan: Return in about 1 year (around 07/02/2025) for Annual Physical.   LABORATORY TESTING:  - Pap smear: not applicable  IMMUNIZATIONS:   - Tdap: Tetanus vaccination status reviewed: Up To Date - Influenza: Provided Today - Pneumovax: Up To Date - Prevnar: Up To Date - HPV: Not applicable - Zostavax vaccine: Refused  SCREENING: -Mammogram: Ordered today  - Colonoscopy: Up To Date -- last on 04/23/27 - Bone Density: Ordered today  -Hearing Test: Not applicable  -Spirometry: Not applicable   PATIENT COUNSELING:   Advised to take 1 mg of folate supplement per day if capable of  pregnancy.   Sexuality: Discussed sexually transmitted diseases, partner selection, use of condoms, avoidance of unintended pregnancy  and contraceptive alternatives.   Advised to avoid cigarette smoking.  I discussed with the patient that most people either abstain from alcohol or drink within safe limits (<=14/week and <=4 drinks/occasion for males, <=7/weeks and <= 3 drinks/occasion for females) and that the risk for alcohol disorders and other health effects rises proportionally with the number of drinks per week and how often a drinker exceeds daily limits.  Discussed cessation/primary prevention of drug use and availability of treatment for abuse.   Diet: Encouraged to adjust caloric intake to maintain  or achieve ideal body weight, to reduce intake of dietary saturated fat and total fat, to limit sodium intake by avoiding high sodium foods and not adding table salt, and to maintain adequate dietary potassium and calcium preferably from fresh fruits, vegetables, and low-fat dairy products.    Stressed the importance of regular exercise  Injury prevention: Discussed safety belts, safety helmets, smoke detector, smoking near bedding or upholstery.   Dental health: Discussed importance of regular tooth brushing, flossing, and dental visits.    NEXT PREVENTATIVE PHYSICAL DUE IN 1 YEAR. Return in about 1 year (around 07/02/2025) for Annual Physical.

## 2024-07-02 NOTE — Assessment & Plan Note (Signed)
 Noted on CT 04/20/2017, would benefit from statin therapy, but refuses.  Check lipid panel today and if elevations continue to recommend statin.

## 2024-07-02 NOTE — Assessment & Plan Note (Signed)
 Ongoing, taking supplement.  Have recommended scheduling DEXA scan to further assess bone health.  Continue supplement.

## 2024-07-03 ENCOUNTER — Ambulatory Visit: Payer: Self-pay | Admitting: Nurse Practitioner

## 2024-07-03 DIAGNOSIS — R7989 Other specified abnormal findings of blood chemistry: Secondary | ICD-10-CM

## 2024-07-03 LAB — CBC WITH DIFFERENTIAL/PLATELET
Basophils Absolute: 0 x10E3/uL (ref 0.0–0.2)
Basos: 1 %
EOS (ABSOLUTE): 0.1 x10E3/uL (ref 0.0–0.4)
Eos: 2 %
Hematocrit: 40.9 % (ref 34.0–46.6)
Hemoglobin: 13.3 g/dL (ref 11.1–15.9)
Immature Grans (Abs): 0 x10E3/uL (ref 0.0–0.1)
Immature Granulocytes: 0 %
Lymphocytes Absolute: 1.9 x10E3/uL (ref 0.7–3.1)
Lymphs: 29 %
MCH: 31.3 pg (ref 26.6–33.0)
MCHC: 32.5 g/dL (ref 31.5–35.7)
MCV: 96 fL (ref 79–97)
Monocytes Absolute: 0.6 x10E3/uL (ref 0.1–0.9)
Monocytes: 9 %
Neutrophils Absolute: 3.9 x10E3/uL (ref 1.4–7.0)
Neutrophils: 59 %
Platelets: 343 x10E3/uL (ref 150–450)
RBC: 4.25 x10E6/uL (ref 3.77–5.28)
RDW: 12.1 % (ref 11.7–15.4)
WBC: 6.7 x10E3/uL (ref 3.4–10.8)

## 2024-07-03 LAB — VITAMIN D 25 HYDROXY (VIT D DEFICIENCY, FRACTURES): Vit D, 25-Hydroxy: 35.7 ng/mL (ref 30.0–100.0)

## 2024-07-03 LAB — COMPREHENSIVE METABOLIC PANEL WITH GFR
ALT: 11 IU/L (ref 0–32)
AST: 17 IU/L (ref 0–40)
Albumin: 4 g/dL (ref 3.8–4.8)
Alkaline Phosphatase: 113 IU/L (ref 49–135)
BUN/Creatinine Ratio: 19 (ref 12–28)
BUN: 13 mg/dL (ref 8–27)
Bilirubin Total: 0.4 mg/dL (ref 0.0–1.2)
CO2: 24 mmol/L (ref 20–29)
Calcium: 9.7 mg/dL (ref 8.7–10.3)
Chloride: 105 mmol/L (ref 96–106)
Creatinine, Ser: 0.68 mg/dL (ref 0.57–1.00)
Globulin, Total: 2.8 g/dL (ref 1.5–4.5)
Glucose: 78 mg/dL (ref 70–99)
Potassium: 3.9 mmol/L (ref 3.5–5.2)
Sodium: 142 mmol/L (ref 134–144)
Total Protein: 6.8 g/dL (ref 6.0–8.5)
eGFR: 91 mL/min/1.73 (ref >59)

## 2024-07-03 LAB — LIPID PANEL W/O CHOL/HDL RATIO
Cholesterol, Total: 170 mg/dL (ref 100–199)
HDL: 62 mg/dL (ref >39)
LDL Chol Calc (NIH): 90 mg/dL (ref 0–99)
Triglycerides: 101 mg/dL (ref 0–149)
VLDL Cholesterol Cal: 18 mg/dL (ref 5–40)

## 2024-07-03 LAB — TSH: TSH: 0.382 u[IU]/mL — ABNORMAL LOW (ref 0.450–4.500)

## 2024-07-10 ENCOUNTER — Telehealth: Payer: Self-pay | Admitting: Nurse Practitioner

## 2024-07-10 NOTE — Telephone Encounter (Unsigned)
 Copied from CRM [id number]. Topic: Clinical - Lab/Test Results >> Jul 09, 2024  1:30 PM Zy'onna H wrote: Reason for CRM: Patient called in requesting lab results be reviewed.   I called the CAL but they were unable to help, I found the results an advised the patient. >> Jul 10, 2024  4:40 PM Roselie BROCKS wrote: Patient stated she received a call to provide her lab results, she informed me she has already spoke to someone and received the results, patient understood.

## 2024-09-09 ENCOUNTER — Ambulatory Visit

## 2024-09-10 ENCOUNTER — Telehealth: Payer: Self-pay | Admitting: Nurse Practitioner

## 2024-09-10 NOTE — Telephone Encounter (Signed)
 Copied from CRM [id number]. Topic: Appointments - Appointment Cancel/Reschedule >> Sep 09, 2024  4:04 PM Delon DASEN wrote: Patient missed AWV, system not allowing to reschedule.

## 2024-09-10 NOTE — Telephone Encounter (Signed)
 Called to schedule AWV. Left a message for her to call back, if she does. This can be scheduled as a new AWV if the missed visit cannot be rescheduled. Or be sure to change the visit to in person instead of by phone.

## 2024-09-12 NOTE — Telephone Encounter (Signed)
 Called patient to reschedule AWV. Please schedule a new AWV for patient. In person

## 2024-09-12 NOTE — Telephone Encounter (Unsigned)
 Copied from CRM [id number]. Topic: Appointments - Scheduling Inquiry for Clinic >> Sep 11, 2024  9:21 AM Treva T wrote: Reason for CRM: Pt calling to reschedule Medicare AWV, form 09/09/24.  Attempted to schedule appt, per EPIC unable to do so, as scheduling template would not populate for this visit type.  Pt requesting a return call to schedule Medicare AWV.  Pt is aware of same day return call.  Ph. [phone]

## 2025-07-06 ENCOUNTER — Encounter: Admitting: Nurse Practitioner
